# Patient Record
Sex: Female | Born: 1935 | Race: White | Hispanic: No | Smoking: Never smoker
Health system: Southern US, Community
[De-identification: ages and names within clinical notes are randomized; demographics above are authoritative.]

## PROBLEM LIST (undated history)

## (undated) DIAGNOSIS — J309 Allergic rhinitis, unspecified: Secondary | ICD-10-CM

## (undated) DIAGNOSIS — E785 Hyperlipidemia, unspecified: Secondary | ICD-10-CM

## (undated) DIAGNOSIS — I10 Essential (primary) hypertension: Secondary | ICD-10-CM

## (undated) DIAGNOSIS — F0390 Unspecified dementia without behavioral disturbance: Secondary | ICD-10-CM

## (undated) DIAGNOSIS — M170 Bilateral primary osteoarthritis of knee: Secondary | ICD-10-CM

## (undated) DIAGNOSIS — R413 Other amnesia: Secondary | ICD-10-CM

## (undated) HISTORY — DX: Other amnesia: R41.3

## (undated) HISTORY — DX: Bilateral primary osteoarthritis of knee: M17.0

## (undated) HISTORY — PX: TONSILLECTOMY: SUR1361

## (undated) HISTORY — DX: Allergic rhinitis, unspecified: J30.9

## (undated) HISTORY — PX: CHOLECYSTECTOMY: SHX55

---

## 2013-03-18 DIAGNOSIS — E7800 Pure hypercholesterolemia, unspecified: Secondary | ICD-10-CM | POA: Diagnosis not present

## 2013-03-18 DIAGNOSIS — M810 Age-related osteoporosis without current pathological fracture: Secondary | ICD-10-CM | POA: Diagnosis not present

## 2013-03-18 DIAGNOSIS — R413 Other amnesia: Secondary | ICD-10-CM | POA: Diagnosis not present

## 2013-03-18 DIAGNOSIS — I10 Essential (primary) hypertension: Secondary | ICD-10-CM | POA: Diagnosis not present

## 2013-06-18 ENCOUNTER — Emergency Department (HOSPITAL_BASED_OUTPATIENT_CLINIC_OR_DEPARTMENT_OTHER)
Admission: EM | Admit: 2013-06-18 | Discharge: 2013-06-18 | Disposition: A | Discharge status: Home / Self Care | Payer: Medicare Other | Attending: Emergency Medicine | Admitting: Emergency Medicine

## 2013-06-18 ENCOUNTER — Emergency Department (HOSPITAL_BASED_OUTPATIENT_CLINIC_OR_DEPARTMENT_OTHER): Payer: Medicare Other

## 2013-06-18 ENCOUNTER — Encounter (HOSPITAL_BASED_OUTPATIENT_CLINIC_OR_DEPARTMENT_OTHER): Payer: Self-pay | Admitting: Emergency Medicine

## 2013-06-18 DIAGNOSIS — W19XXXA Unspecified fall, initial encounter: Secondary | ICD-10-CM

## 2013-06-18 DIAGNOSIS — E785 Hyperlipidemia, unspecified: Secondary | ICD-10-CM | POA: Diagnosis not present

## 2013-06-18 DIAGNOSIS — Z79899 Other long term (current) drug therapy: Secondary | ICD-10-CM | POA: Insufficient documentation

## 2013-06-18 DIAGNOSIS — S0990XA Unspecified injury of head, initial encounter: Secondary | ICD-10-CM | POA: Insufficient documentation

## 2013-06-18 DIAGNOSIS — S0180XA Unspecified open wound of other part of head, initial encounter: Secondary | ICD-10-CM | POA: Diagnosis not present

## 2013-06-18 DIAGNOSIS — S032XXA Dislocation of tooth, initial encounter: Secondary | ICD-10-CM

## 2013-06-18 DIAGNOSIS — S199XXA Unspecified injury of neck, initial encounter: Secondary | ICD-10-CM | POA: Diagnosis not present

## 2013-06-18 DIAGNOSIS — T07XXXA Unspecified multiple injuries, initial encounter: Secondary | ICD-10-CM

## 2013-06-18 DIAGNOSIS — Y92009 Unspecified place in unspecified non-institutional (private) residence as the place of occurrence of the external cause: Secondary | ICD-10-CM | POA: Insufficient documentation

## 2013-06-18 DIAGNOSIS — F0390 Unspecified dementia without behavioral disturbance: Secondary | ICD-10-CM | POA: Insufficient documentation

## 2013-06-18 DIAGNOSIS — S0120XA Unspecified open wound of nose, initial encounter: Secondary | ICD-10-CM | POA: Diagnosis not present

## 2013-06-18 DIAGNOSIS — W010XXA Fall on same level from slipping, tripping and stumbling without subsequent striking against object, initial encounter: Secondary | ICD-10-CM | POA: Insufficient documentation

## 2013-06-18 DIAGNOSIS — Y9389 Activity, other specified: Secondary | ICD-10-CM | POA: Insufficient documentation

## 2013-06-18 DIAGNOSIS — IMO0002 Reserved for concepts with insufficient information to code with codable children: Secondary | ICD-10-CM | POA: Insufficient documentation

## 2013-06-18 DIAGNOSIS — S025XXA Fracture of tooth (traumatic), initial encounter for closed fracture: Secondary | ICD-10-CM | POA: Diagnosis not present

## 2013-06-18 DIAGNOSIS — S0993XA Unspecified injury of face, initial encounter: Secondary | ICD-10-CM | POA: Diagnosis not present

## 2013-06-18 DIAGNOSIS — I10 Essential (primary) hypertension: Secondary | ICD-10-CM | POA: Diagnosis not present

## 2013-06-18 HISTORY — DX: Essential (primary) hypertension: I10

## 2013-06-18 HISTORY — DX: Unspecified dementia, unspecified severity, without behavioral disturbance, psychotic disturbance, mood disturbance, and anxiety: F03.90

## 2013-06-18 HISTORY — DX: Hyperlipidemia, unspecified: E78.5

## 2013-06-18 MED ORDER — TRAMADOL HCL 50 MG PO TABS: 50.0000 mg | ORAL_TABLET | Freq: Four times a day (QID) | ORAL | Status: DC | PRN

## 2013-06-18 NOTE — ED Notes (Addendum)
Tripped and fell in her driveway, hitting her face on the concrete.  Denies LOC.  Reports injury to left knee as well.  Noting abrasions to her lip, nose,forehead, and a possible dental injury.

## 2013-06-18 NOTE — Discharge Instructions (Signed)
Abrasions An abrasion is a cut or scrape of the skin. Abrasions do not go through all layers of the skin. HOME CARE  If a bandage (dressing) was put on your wound, change it as told by your doctor. If the bandage sticks, soak it off with warm.  Wash the area with water and soap 2 times a day. Rinse off the soap. Pat the area dry with a clean towel.  Put on medicated cream (ointment) as told by your doctor.  Change your bandage right away if it gets wet or dirty.  Only take medicine as told by your doctor.  See your doctor within 24 48 hours to get your wound checked.  Check your wound for redness, puffiness (swelling), or yellowish-white fluid (pus). GET HELP RIGHT AWAY IF:   You have more pain in the wound.  You have redness, swelling, or tenderness around the wound.  You have pus coming from the wound.  You have a fever or lasting symptoms for more than 2 3 days.  You have a fever and your symptoms suddenly get worse.  You have a bad smell coming from the wound or bandage. MAKE SURE YOU:   Understand these instructions.  Will watch your condition.  Will get help right away if you are not doing well or get worse. Document Released: 07/09/2007 Document Revised: 10/15/2011 Document Reviewed: 12/24/2010 [facility] Patient Information 2014 [place], [place].   Head CT is CT of face and CT of neck all negative. Local wound care for the abrasions. Followup with your doctor as needed. As we discussed very important to followup with a dentist. Recommend soft diet to help her texture upper incisor. Return for any new or worse symptoms.

## 2013-06-18 NOTE — ED Provider Notes (Signed)
CSN: [id number]     Arrival date & time 06/18/13  1301 History   First MD Initiated Contact with Patient 06/18/13 1321     Chief Complaint  Patient presents with  . Fall     (Consider location/radiation/quality/duration/timing/severity/associated sxs/prior Treatment) Patient is a 80 y.o. female presenting with fall. The history is provided by the patient and a relative.  Fall Pertinent negatives include no chest pain, no abdominal pain and no shortness of breath.   patient tripped and fell on the driveway landing on her face. No loss of consciousness. Patient with complaint of an abrasion to left knee and abrasions to her upper lip face for head and nose. Questionable dental injury her left upper tooth is pushed back. Denies any chest pain shortness of breath back pain or abdominal pain. No hip pain. Patient has been up and walking since the incident.  Past Medical History  Diagnosis Date  . Hypertension   . Hyperlipidemia   . Dementia    Past Surgical History  Procedure Laterality Date  . Tonsillectomy    . Cholecystectomy     No family history on file. History  Substance Use Topics  . Smoking status: Never Smoker   . Smokeless tobacco: Not on file  . Alcohol Use: Yes   OB History   Grav Para Term Preterm Abortions TAB SAB Ect Mult Living                 Review of Systems  Constitutional: Negative for fever.  HENT: Positive for nosebleeds. Negative for congestion.   Eyes: Negative for pain and visual disturbance.  Respiratory: Negative for shortness of breath.   Cardiovascular: Negative for chest pain.  Gastrointestinal: Negative for abdominal pain.  Genitourinary: Negative for dysuria.  Musculoskeletal: Negative for back pain.  Skin: Positive for wound.  Neurological: Negative for syncope.  Hematological: Does not bruise/bleed easily.  Psychiatric/Behavioral: Negative for confusion.      Allergies  Review of patient's allergies indicates no known  allergies.  Home Medications   Prior to Admission medications   Medication Sig Start Date End Date Taking? Authorizing Provider  donepezil (ARICEPT) 10 MG tablet Take 10 mg by mouth at bedtime.   Yes Historical Provider, MD  hydrochlorothiazide (HYDRODIURIL) 25 MG tablet Take 12.5 mg by mouth. Monday, Wed, Friday   Yes Historical Provider, MD  losartan (COZAAR) 100 MG tablet Take 100 mg by mouth daily.   Yes Historical Provider, MD  memantine (NAMENDA) 10 MG tablet Take 10 mg by mouth 2 (two) times daily.   Yes Historical Provider, MD  risedronate (ACTONEL) 35 MG tablet Take 35 mg by mouth every 7 (seven) days. with water on empty stomach, nothing by mouth or lie down for next 30 minutes.   Yes Historical Provider, MD  simvastatin (ZOCOR) 20 MG tablet Take 20 mg by mouth daily.   Yes Historical Provider, MD  traMADol (ULTRAM) 50 MG tablet Take 1 tablet (50 mg total) by mouth every 6 (six) hours as needed. 06/18/13   Mervin Kung, MD   BP 150/75  Pulse 89  Temp(Src) 98.3 F (36.8 C) (Oral)  Resp 20  Ht 5\' 2"  (1.575 m)  Wt 142 lb (64.411 kg)  BMI 25.97 kg/m2  SpO2 98% Physical Exam  Nursing note and vitals reviewed. Constitutional: She is oriented to person, place, and time. She appears well-developed and well-nourished. She appears distressed.  HENT:  Head: Normocephalic.  Mouth/Throat: Oropharynx is clear and moist.  Right nares  with some dried blood no active bleeding. Abrasion to the bridge of the nose 4 head and upper lip. No through and through lacerations of the upper lip. Tongue is okay. Teeth intact with the exception of the left upper incisor which is pushed back but it is not easily movable. No hematoma to the gum line.  Eyes: Conjunctivae and EOM are normal. Pupils are equal, round, and reactive to light.  Neck: Normal range of motion. Neck supple.  Cardiovascular: Normal rate, regular rhythm and normal heart sounds.   Pulmonary/Chest: Effort normal and breath sounds  normal. No respiratory distress.  Abdominal: Soft. Bowel sounds are normal. There is no tenderness.  Musculoskeletal: Normal range of motion.  Superficial abrasion to left knee no active bleeding.  Neurological: She is alert and oriented to person, place, and time. No cranial nerve deficit. She exhibits normal muscle tone. Coordination normal.  Skin: Skin is warm.    ED Course  Procedures (including critical care time) Labs Review Labs Reviewed - No data to display  Imaging Review Ct Head Wo Contrast  06/18/2013   CLINICAL DATA:  Status post fall with face forward onto a cement driveway. Laceration to the forehead, bridge of nose and upper lip.  EXAM: CT HEAD WITHOUT CONTRAST  CT MAXILLOFACIAL WITHOUT CONTRAST  CT CERVICAL SPINE WITHOUT CONTRAST  TECHNIQUE: Multidetector CT imaging of the head, cervical spine, and maxillofacial structures were performed using the standard protocol without intravenous contrast. Multiplanar CT image reconstructions of the cervical spine and maxillofacial structures were also generated.  COMPARISON:  None.  FINDINGS: CT HEAD FINDINGS  There is chronic diffuse atrophy. Chronic bilateral periventricular white matter small vessel ischemic changes identified. There is no midline shift, hydrocephalus, or mass. No acute hemorrhage or acute transcortical infarct is identified. The bony calvarium is intact. There is mucoperiosteal thickening of the sphenoid sinus.  CT MAXILLOFACIAL FINDINGS  There is soft tissue swelling/ small hematoma surrounding the nose particularly on the right. There is no acute fracture or dislocation. Orbits are normal. There is minimal mucoperiosteal thickening of the sphenoid sinus.  CT CERVICAL SPINE FINDINGS  There is no acute fracture or dislocation. There are degenerative joint changes with narrowed joint space and osteophyte formation. The prevertebral soft tissues are normal. There is mild generalized enlargement of the right thyroid gland  relative to the left thyroid gland but this is incompletely included. The visualized lung apices are clear.  IMPRESSION: No focal acute intracranial abnormality identified. Chronic diffuse atrophy. Chronic bilateral periventricular white matter small vessel ischemic change.  Soft tissue swelling/ hematoma surrounding the nose particularly on the right. There is no acute fracture or dislocation of maxillofacial bones.  Degenerative joint changes of cervical spine. There is no acute fracture or dislocation of cervical spine.   Electronically Signed   By: Abelardo Diesel M.D.   On: 06/18/2013 14:23   Ct Cervical Spine Wo Contrast  06/18/2013   CLINICAL DATA:  Status post fall with face forward onto a cement driveway. Laceration to the forehead, bridge of nose and upper lip.  EXAM: CT HEAD WITHOUT CONTRAST  CT MAXILLOFACIAL WITHOUT CONTRAST  CT CERVICAL SPINE WITHOUT CONTRAST  TECHNIQUE: Multidetector CT imaging of the head, cervical spine, and maxillofacial structures were performed using the standard protocol without intravenous contrast. Multiplanar CT image reconstructions of the cervical spine and maxillofacial structures were also generated.  COMPARISON:  None.  FINDINGS: CT HEAD FINDINGS  There is chronic diffuse atrophy. Chronic bilateral periventricular white matter small vessel  ischemic changes identified. There is no midline shift, hydrocephalus, or mass. No acute hemorrhage or acute transcortical infarct is identified. The bony calvarium is intact. There is mucoperiosteal thickening of the sphenoid sinus.  CT MAXILLOFACIAL FINDINGS  There is soft tissue swelling/ small hematoma surrounding the nose particularly on the right. There is no acute fracture or dislocation. Orbits are normal. There is minimal mucoperiosteal thickening of the sphenoid sinus.  CT CERVICAL SPINE FINDINGS  There is no acute fracture or dislocation. There are degenerative joint changes with narrowed joint space and osteophyte  formation. The prevertebral soft tissues are normal. There is mild generalized enlargement of the right thyroid gland relative to the left thyroid gland but this is incompletely included. The visualized lung apices are clear.  IMPRESSION: No focal acute intracranial abnormality identified. Chronic diffuse atrophy. Chronic bilateral periventricular white matter small vessel ischemic change.  Soft tissue swelling/ hematoma surrounding the nose particularly on the right. There is no acute fracture or dislocation of maxillofacial bones.  Degenerative joint changes of cervical spine. There is no acute fracture or dislocation of cervical spine.   Electronically Signed   By: Abelardo Diesel M.D.   On: 06/18/2013 14:23   Ct Maxillofacial Wo Cm  06/18/2013   CLINICAL DATA:  Status post fall with face forward onto a cement driveway. Laceration to the forehead, bridge of nose and upper lip.  EXAM: CT HEAD WITHOUT CONTRAST  CT MAXILLOFACIAL WITHOUT CONTRAST  CT CERVICAL SPINE WITHOUT CONTRAST  TECHNIQUE: Multidetector CT imaging of the head, cervical spine, and maxillofacial structures were performed using the standard protocol without intravenous contrast. Multiplanar CT image reconstructions of the cervical spine and maxillofacial structures were also generated.  COMPARISON:  None.  FINDINGS: CT HEAD FINDINGS  There is chronic diffuse atrophy. Chronic bilateral periventricular white matter small vessel ischemic changes identified. There is no midline shift, hydrocephalus, or mass. No acute hemorrhage or acute transcortical infarct is identified. The bony calvarium is intact. There is mucoperiosteal thickening of the sphenoid sinus.  CT MAXILLOFACIAL FINDINGS  There is soft tissue swelling/ small hematoma surrounding the nose particularly on the right. There is no acute fracture or dislocation. Orbits are normal. There is minimal mucoperiosteal thickening of the sphenoid sinus.  CT CERVICAL SPINE FINDINGS  There is no acute  fracture or dislocation. There are degenerative joint changes with narrowed joint space and osteophyte formation. The prevertebral soft tissues are normal. There is mild generalized enlargement of the right thyroid gland relative to the left thyroid gland but this is incompletely included. The visualized lung apices are clear.  IMPRESSION: No focal acute intracranial abnormality identified. Chronic diffuse atrophy. Chronic bilateral periventricular white matter small vessel ischemic change.  Soft tissue swelling/ hematoma surrounding the nose particularly on the right. There is no acute fracture or dislocation of maxillofacial bones.  Degenerative joint changes of cervical spine. There is no acute fracture or dislocation of cervical spine.   Electronically Signed   By: Abelardo Diesel M.D.   On: 06/18/2013 14:23     EKG Interpretation None      MDM   Final diagnoses:  Fall  Abrasions of multiple sites  Tooth avulsion  Head injury    Patient with a fall no loss of consciousness. Abrasions to the 4 head and face. An upper lip. CT of head neck and face without any acute fractures or injuries. However clinically patient's left upper incisor is avulsed back will need to followup with a dentist. Patient to soft  diet the meantime. Patient was treated with tramadol for pain and also a wound care for the abrasions. Patient denied any neck pain back pain arm pain hip pain. Patient also with abrasion to the left knee.    Mervin Kung, MD 06/18/13 [phone]

## 2013-09-23 DIAGNOSIS — E559 Vitamin D deficiency, unspecified: Secondary | ICD-10-CM | POA: Diagnosis not present

## 2013-09-23 DIAGNOSIS — E7800 Pure hypercholesterolemia, unspecified: Secondary | ICD-10-CM | POA: Diagnosis not present

## 2013-09-23 DIAGNOSIS — Z23 Encounter for immunization: Secondary | ICD-10-CM | POA: Diagnosis not present

## 2013-09-23 DIAGNOSIS — I10 Essential (primary) hypertension: Secondary | ICD-10-CM | POA: Diagnosis not present

## 2015-01-04 DIAGNOSIS — I10 Essential (primary) hypertension: Secondary | ICD-10-CM | POA: Diagnosis not present

## 2015-01-04 DIAGNOSIS — N390 Urinary tract infection, site not specified: Secondary | ICD-10-CM | POA: Diagnosis not present

## 2015-01-04 DIAGNOSIS — J309 Allergic rhinitis, unspecified: Secondary | ICD-10-CM | POA: Diagnosis not present

## 2015-01-04 DIAGNOSIS — M810 Age-related osteoporosis without current pathological fracture: Secondary | ICD-10-CM | POA: Diagnosis not present

## 2015-01-04 DIAGNOSIS — M170 Bilateral primary osteoarthritis of knee: Secondary | ICD-10-CM | POA: Diagnosis not present

## 2015-01-04 DIAGNOSIS — E7800 Pure hypercholesterolemia, unspecified: Secondary | ICD-10-CM | POA: Diagnosis not present

## 2015-01-04 DIAGNOSIS — R413 Other amnesia: Secondary | ICD-10-CM | POA: Diagnosis not present

## 2015-01-16 DIAGNOSIS — M810 Age-related osteoporosis without current pathological fracture: Secondary | ICD-10-CM | POA: Diagnosis not present

## 2015-01-16 DIAGNOSIS — E559 Vitamin D deficiency, unspecified: Secondary | ICD-10-CM | POA: Diagnosis not present

## 2015-01-16 DIAGNOSIS — I10 Essential (primary) hypertension: Secondary | ICD-10-CM | POA: Diagnosis not present

## 2015-01-16 DIAGNOSIS — Z0000 Encounter for general adult medical examination without abnormal findings: Secondary | ICD-10-CM | POA: Diagnosis not present

## 2015-01-16 DIAGNOSIS — E7800 Pure hypercholesterolemia, unspecified: Secondary | ICD-10-CM | POA: Diagnosis not present

## 2015-01-22 ENCOUNTER — Encounter: Payer: Self-pay | Admitting: Neurology

## 2015-01-22 ENCOUNTER — Ambulatory Visit: Payer: Medicare Other | Admitting: Neurology

## 2015-01-22 ENCOUNTER — Telehealth: Payer: Self-pay

## 2015-01-22 NOTE — Telephone Encounter (Signed)
Patient did not show to new appt

## 2015-02-09 IMAGING — CT CT CERVICAL SPINE W/O CM
4 of 5 series · 15 of 33 positions shown, 17 images · non-contrast
Comparison: None.

CLINICAL DATA: Status post fall with face forward onto a cement
driveway. Laceration to the forehead, bridge of nose and upper lip.

EXAM:
CT HEAD WITHOUT CONTRAST
CT MAXILLOFACIAL WITHOUT CONTRAST
CT CERVICAL SPINE WITHOUT CONTRAST
TECHNIQUE: Multidetector CT imaging of the head, cervical spine, and
maxillofacial structures were performed using the standard protocol
without intravenous contrast. Multiplanar CT image reconstructions
of the cervical spine and maxillofacial structures were also
generated.

[Series 11: c_spine 2.0 b41s st · axial · 0.23mm/px · z∈[-266,-194]mm · 3 of 73 slices shown]
[im 19/73  bone]
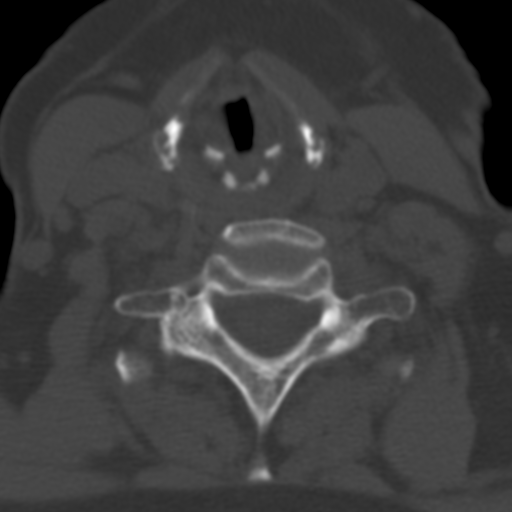
[im 37/73  bone]
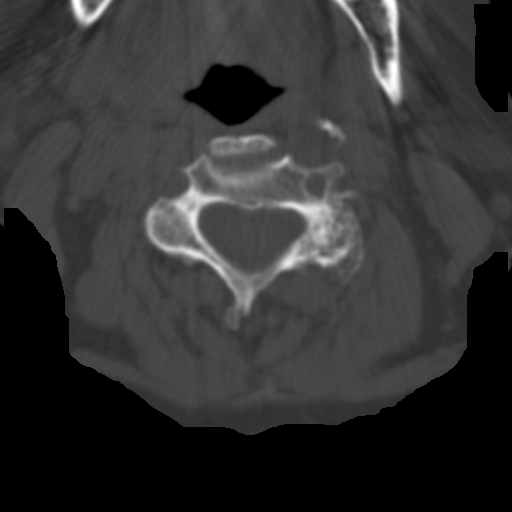
[im 55/73  bone]
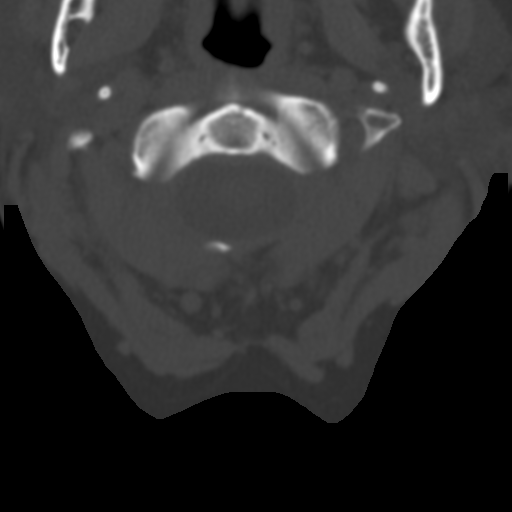

[Series 14: c_spine 2.0 coronal · coronal · 0.25mm/px · 3 of 34 slices shown]
[im 7/34  bone]
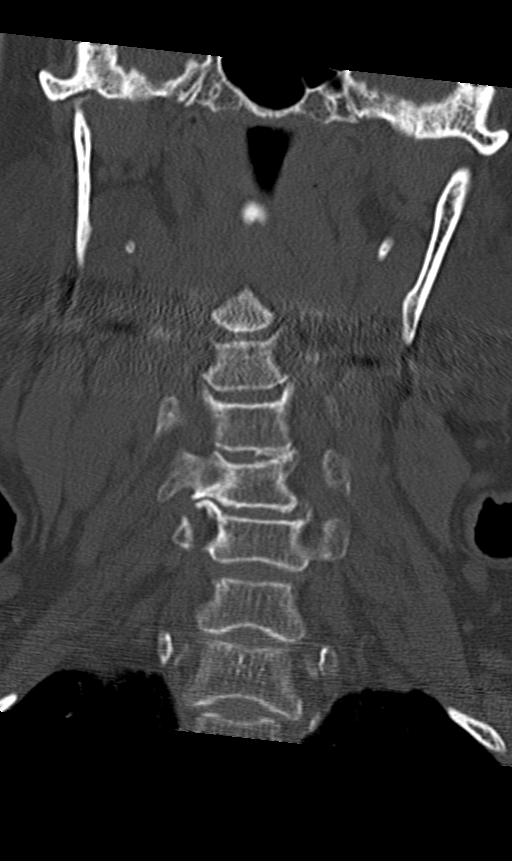
[im 14/34  bone]
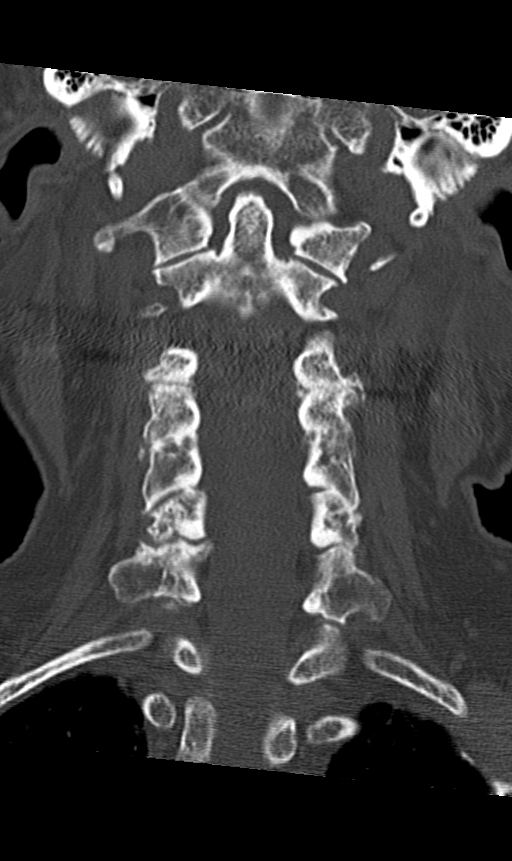
[im 20/34  bone]
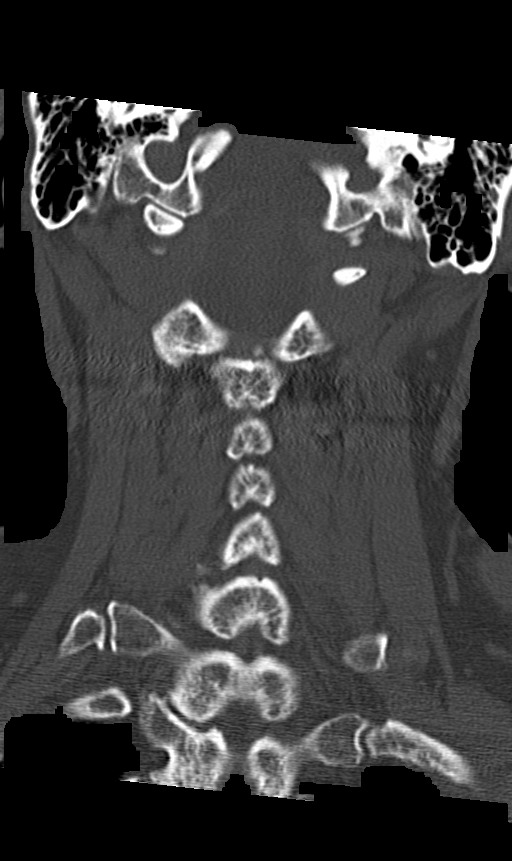

[Series 15: c_spine 2.0 sagittal · sagittal · 0.37mm/px · 5 of 38 slices shown, 6 images]
[im 13/38  bone]
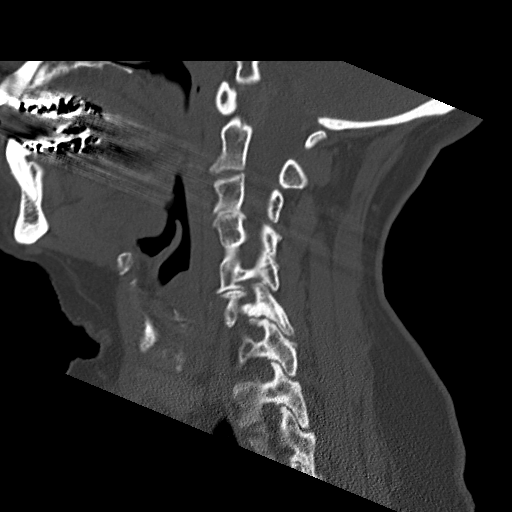
[im 16/38  bone]
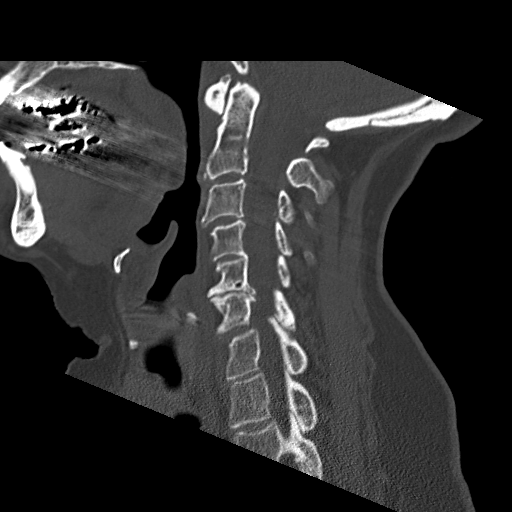
[im 19/38  soft-tissue]
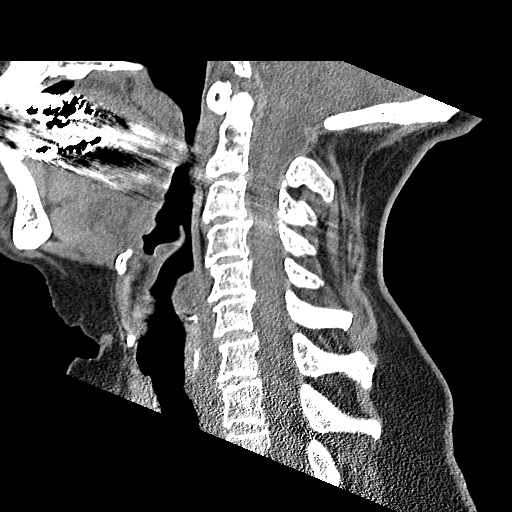
[im 19/38  bone]
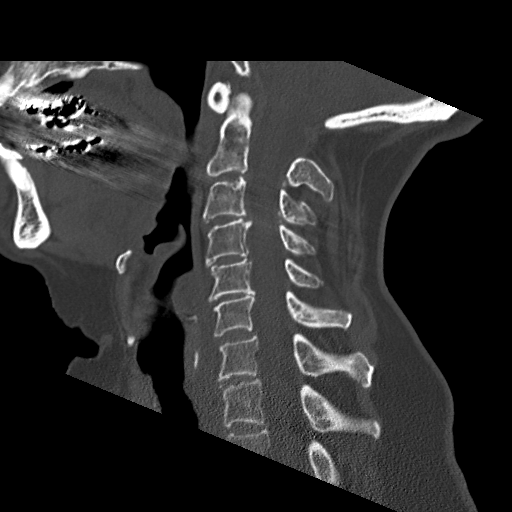
[im 22/38  bone]
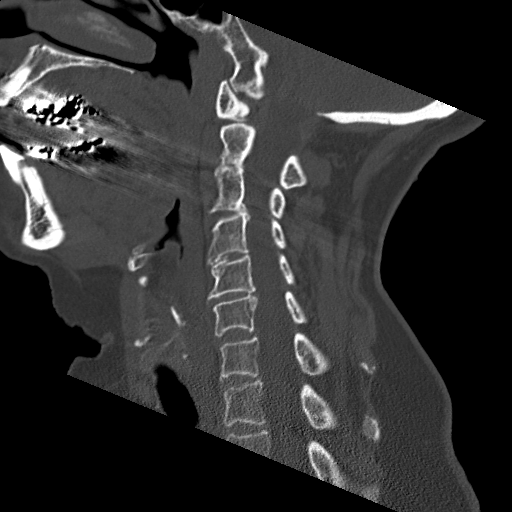
[im 25/38  bone]
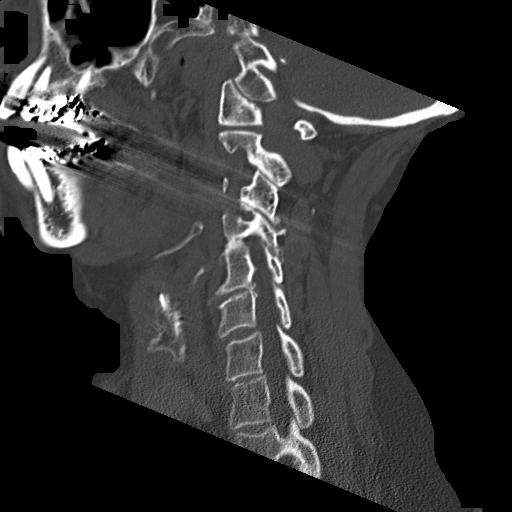

[Series 16: c_spine 2.0 orth ax · axial · 0.26mm/px · z∈[-306,-217]mm · 4 of 80 slices shown, 5 images]
[im 16/80  soft-tissue]
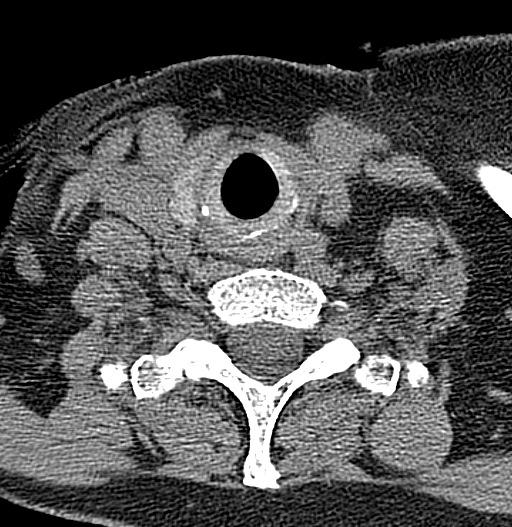
[im 16/80  bone]
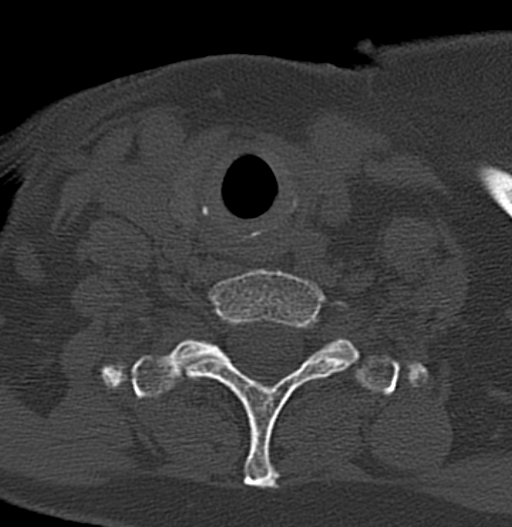
[im 32/80  bone]
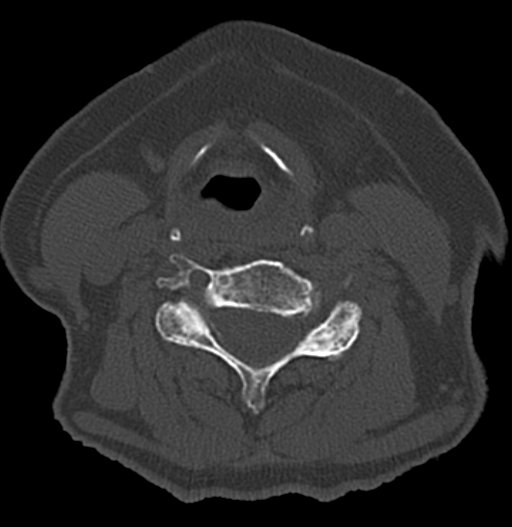
[im 48/80  bone]
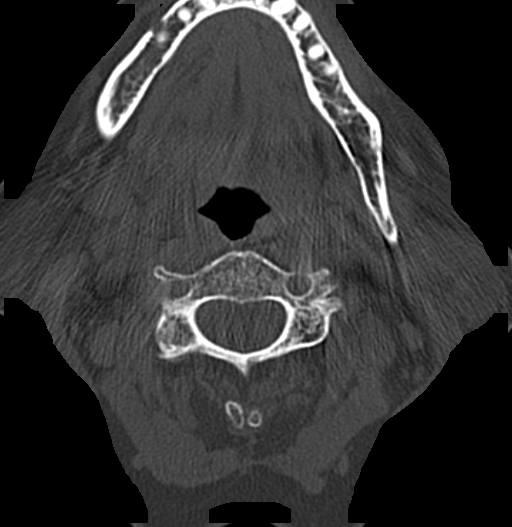
[im 64/80  bone]
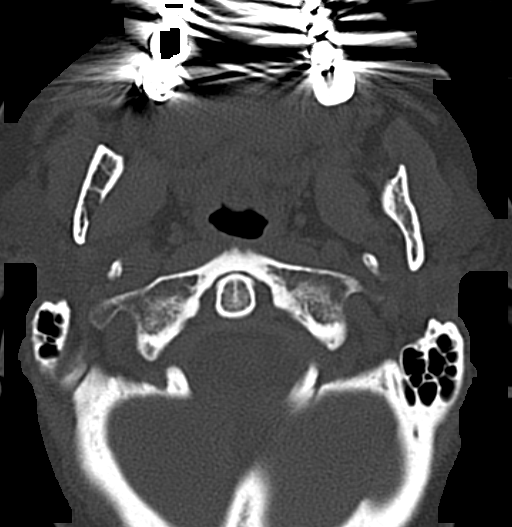

[15 of 33 positions shown; findings below may reference images not displayed]

FINDINGS: CT HEAD FINDINGS

There is chronic diffuse atrophy. Chronic bilateral periventricular
white matter small vessel ischemic changes identified. There is no
midline shift, hydrocephalus, or mass. No acute hemorrhage or acute
transcortical infarct is identified. The bony calvarium is intact.
There is mucoperiosteal thickening of the sphenoid sinus.

CT MAXILLOFACIAL FINDINGS

There is soft tissue swelling/ small hematoma surrounding the nose
particularly on the right. There is no acute fracture or
dislocation. Orbits are normal. There is minimal mucoperiosteal
thickening of the sphenoid sinus.

CT CERVICAL SPINE FINDINGS

There is no acute fracture or dislocation. There are degenerative
joint changes with narrowed joint space and osteophyte formation.
The prevertebral soft tissues are normal. There is mild generalized
enlargement of the right thyroid gland relative to the left thyroid
gland but this is incompletely included. The visualized lung apices
are clear.
IMPRESSION: No focal acute intracranial abnormality identified. Chronic diffuse
atrophy. Chronic bilateral periventricular white matter small vessel
ischemic change.

Soft tissue swelling/ hematoma surrounding the nose particularly on
the right. There is no acute fracture or dislocation of
maxillofacial bones.

Degenerative joint changes of cervical spine. There is no acute
fracture or dislocation of cervical spine.

## 2015-07-17 DIAGNOSIS — I10 Essential (primary) hypertension: Secondary | ICD-10-CM | POA: Diagnosis not present

## 2015-07-17 DIAGNOSIS — E7800 Pure hypercholesterolemia, unspecified: Secondary | ICD-10-CM | POA: Diagnosis not present

## 2015-07-17 DIAGNOSIS — M810 Age-related osteoporosis without current pathological fracture: Secondary | ICD-10-CM | POA: Diagnosis not present

## 2015-07-17 DIAGNOSIS — Z0000 Encounter for general adult medical examination without abnormal findings: Secondary | ICD-10-CM | POA: Diagnosis not present

## 2016-01-08 DIAGNOSIS — E559 Vitamin D deficiency, unspecified: Secondary | ICD-10-CM | POA: Diagnosis not present

## 2016-01-08 DIAGNOSIS — I10 Essential (primary) hypertension: Secondary | ICD-10-CM | POA: Diagnosis not present

## 2016-01-08 DIAGNOSIS — E7800 Pure hypercholesterolemia, unspecified: Secondary | ICD-10-CM | POA: Diagnosis not present

## 2016-01-08 DIAGNOSIS — M810 Age-related osteoporosis without current pathological fracture: Secondary | ICD-10-CM | POA: Diagnosis not present

## 2016-01-08 DIAGNOSIS — R413 Other amnesia: Secondary | ICD-10-CM | POA: Diagnosis not present

## 2016-01-15 DIAGNOSIS — I10 Essential (primary) hypertension: Secondary | ICD-10-CM | POA: Diagnosis not present

## 2016-01-15 DIAGNOSIS — E673 Hypervitaminosis D: Secondary | ICD-10-CM | POA: Diagnosis not present

## 2016-07-16 DIAGNOSIS — J309 Allergic rhinitis, unspecified: Secondary | ICD-10-CM | POA: Diagnosis not present

## 2016-07-16 DIAGNOSIS — R413 Other amnesia: Secondary | ICD-10-CM | POA: Diagnosis not present

## 2016-07-16 DIAGNOSIS — I10 Essential (primary) hypertension: Secondary | ICD-10-CM | POA: Diagnosis not present

## 2016-07-16 DIAGNOSIS — H6123 Impacted cerumen, bilateral: Secondary | ICD-10-CM | POA: Diagnosis not present

## 2016-07-16 DIAGNOSIS — Z23 Encounter for immunization: Secondary | ICD-10-CM | POA: Diagnosis not present

## 2016-07-16 DIAGNOSIS — M170 Bilateral primary osteoarthritis of knee: Secondary | ICD-10-CM | POA: Diagnosis not present

## 2016-07-16 DIAGNOSIS — E7800 Pure hypercholesterolemia, unspecified: Secondary | ICD-10-CM | POA: Diagnosis not present

## 2017-02-10 DIAGNOSIS — M810 Age-related osteoporosis without current pathological fracture: Secondary | ICD-10-CM | POA: Diagnosis not present

## 2017-02-10 DIAGNOSIS — I10 Essential (primary) hypertension: Secondary | ICD-10-CM | POA: Diagnosis not present

## 2017-03-31 ENCOUNTER — Other Ambulatory Visit: Payer: Self-pay

## 2017-03-31 ENCOUNTER — Encounter (HOSPITAL_BASED_OUTPATIENT_CLINIC_OR_DEPARTMENT_OTHER): Payer: Self-pay | Admitting: *Deleted

## 2017-03-31 ENCOUNTER — Emergency Department (HOSPITAL_BASED_OUTPATIENT_CLINIC_OR_DEPARTMENT_OTHER)
Admission: EM | Admit: 2017-03-31 | Discharge: 2017-03-31 | Disposition: A | Discharge status: Home / Self Care | Payer: Medicare Other | Attending: Emergency Medicine | Admitting: Emergency Medicine

## 2017-03-31 ENCOUNTER — Emergency Department (HOSPITAL_BASED_OUTPATIENT_CLINIC_OR_DEPARTMENT_OTHER): Payer: Medicare Other

## 2017-03-31 DIAGNOSIS — Y929 Unspecified place or not applicable: Secondary | ICD-10-CM | POA: Insufficient documentation

## 2017-03-31 DIAGNOSIS — Y939 Activity, unspecified: Secondary | ICD-10-CM | POA: Diagnosis not present

## 2017-03-31 DIAGNOSIS — Y999 Unspecified external cause status: Secondary | ICD-10-CM | POA: Diagnosis not present

## 2017-03-31 DIAGNOSIS — X58XXXA Exposure to other specified factors, initial encounter: Secondary | ICD-10-CM | POA: Diagnosis not present

## 2017-03-31 DIAGNOSIS — S59901A Unspecified injury of right elbow, initial encounter: Secondary | ICD-10-CM | POA: Diagnosis present

## 2017-03-31 DIAGNOSIS — F0390 Unspecified dementia without behavioral disturbance: Secondary | ICD-10-CM | POA: Insufficient documentation

## 2017-03-31 DIAGNOSIS — S50311A Abrasion of right elbow, initial encounter: Secondary | ICD-10-CM | POA: Diagnosis not present

## 2017-03-31 DIAGNOSIS — I10 Essential (primary) hypertension: Secondary | ICD-10-CM | POA: Insufficient documentation

## 2017-03-31 DIAGNOSIS — Z79899 Other long term (current) drug therapy: Secondary | ICD-10-CM | POA: Insufficient documentation

## 2017-03-31 DIAGNOSIS — Z7982 Long term (current) use of aspirin: Secondary | ICD-10-CM | POA: Diagnosis not present

## 2017-03-31 MED ORDER — DOXYCYCLINE HYCLATE 100 MG PO TABS
100.0000 mg | ORAL_TABLET | Freq: Once | ORAL | Status: AC
  Administered 2017-03-31: 100 mg via ORAL
  Filled 2017-03-31: qty 1

## 2017-03-31 MED ORDER — DOXYCYCLINE HYCLATE 100 MG PO CAPS: 100.0000 mg | ORAL_CAPSULE | Freq: Two times a day (BID) | ORAL | 0 refills | Status: DC

## 2017-03-31 MED ORDER — BACITRACIN ZINC 500 UNIT/GM EX OINT: 1.0000 "application " | TOPICAL_OINTMENT | Freq: Two times a day (BID) | CUTANEOUS | 0 refills | Status: DC

## 2017-03-31 MED FILL — DOXYCYCLINE HYC 100 MG CAP: 100 | 7 days supply | Qty: 14 | Fill #0

## 2017-03-31 NOTE — ED Triage Notes (Signed)
Abrasion to her right elbow. Her daughter saw it last night.

## 2017-03-31 NOTE — ED Provider Notes (Signed)
[facility] EMERGENCY DEPARTMENT Provider Note   CSN: [id number] Arrival date & time: 03/31/17  1237     History   Chief Complaint Chief Complaint  Patient presents with  . Elbow Injury    HPI Candice Horne is a 84 y.o. female hx of HTN, dementia, HL, here presenting with right elbow abrasion.  Patient has a history of dementia and lives at home with her daughter.  Around last night, daughter noticed that there was an abrasion on the right elbow area.  Patient did not remember how she fell.  There is no witnessed syncope or loss of consciousness per daughter.  Patient denies being on blood thinners and denies any falls.  Patient did have a tetanus shot in 2011.   The history is provided by the patient.   Level V caveat- dementia   Past Medical History:  Diagnosis Date  . Allergic rhinitis   . Dementia   . Hyperlipidemia   . Hypertension   . Memory loss   . Osteoarthritis of both knees     There are no active problems to display for this patient.   Past Surgical History:  Procedure Laterality Date  . CHOLECYSTECTOMY    . TONSILLECTOMY      OB History    No data available       Home Medications    Prior to Admission medications   Medication Sig Start Date End Date Taking? Authorizing Provider  aspirin 81 MG tablet Take 81 mg by mouth daily.   Yes [provider]  b complex vitamins tablet Take 1 tablet by mouth daily.   Yes [provider]  cholecalciferol (VITAMIN D) 400 UNITS TABS tablet Take 400 Units by mouth.   Yes [provider]  donepezil (ARICEPT) 10 MG tablet Take 10 mg by mouth at bedtime.   Yes [provider]  Glucosamine-MSM-Hyaluronic Acd (JOINT HEALTH PO) Take by mouth.   Yes [provider]  hydrochlorothiazide (HYDRODIURIL) 25 MG tablet Take 12.5 mg by mouth. Monday, Wed, Friday   Yes [provider]  losartan (COZAAR) 100 MG tablet Take 100 mg by mouth daily.   Yes [provider]  memantine (NAMENDA) 10 MG tablet Take 10 mg by mouth 2 (two) times daily.   Yes [provider]  Omega-3 Fatty Acids (FISH OIL) 1000 MG CAPS Take by mouth.   Yes [provider]  simvastatin (ZOCOR) 20 MG tablet Take 20 mg by mouth daily.   Yes [provider]  risedronate (ACTONEL) 35 MG tablet Take 35 mg by mouth every 7 (seven) days. with water on empty stomach, nothing by mouth or lie down for next 30 minutes.    [provider]    Family History Family History  Problem Relation Age of Onset  . Heart attack Mother   . Cancer Father     Social History Social History   Tobacco Use  . Smoking status: Never Smoker  . Smokeless tobacco: Never Used  Substance Use Topics  . Alcohol use: Yes  . Drug use: No     Allergies   Patient has no known allergies.   Review of Systems Review of Systems  Skin: Positive for wound.  All other systems reviewed and are negative.    Physical Exam Updated Vital Signs BP (!) 122/43   Pulse 74   Temp 98.7 F (37.1 C) (Oral)   Resp 18   Ht 4' 11.5" (1.511 m)   Wt  57.2 kg (126 lb)   SpO2 99%   BMI 25.02 kg/m   Physical Exam  Constitutional: She is oriented to person, place, and time. She appears well-developed.  Demented, pleasant, NAD   HENT:  Head: Normocephalic and atraumatic.  Mouth/Throat: Oropharynx is clear and moist.  No obvious head trauma   Eyes: Conjunctivae and EOM are normal. Pupils are equal, round, and reactive to light.  Neck: Normal range of motion. Neck supple.  Cardiovascular: Normal rate, regular rhythm and normal heart sounds.  Pulmonary/Chest: Effort normal and breath sounds normal. No stridor. No respiratory distress. She has no wheezes.  Abdominal: Soft. Bowel sounds are normal. She exhibits no distension. There is no tenderness.  Musculoskeletal:  Small abrasion lateral aspect R elbow, surrounding redness, no obvious elbow effusion, nl ROM R elbow. 2+  pulses   Neurological: She is alert and oriented to person, place, and time.  Skin: Skin is warm.  Psychiatric: She has a normal mood and affect.  Nursing note and vitals reviewed.    ED Treatments / Results  Labs (all labs ordered are listed, but only abnormal results are displayed) Labs Reviewed - No data to display  EKG  EKG Interpretation None       Radiology Dg Elbow Complete Right  Result Date: 03/31/2017 CLINICAL DATA:  Abrasion due to unwitnessed trauma EXAM: RIGHT ELBOW - COMPLETE 3+ VIEW COMPARISON:  None. FINDINGS: Frontal, lateral, and bilateral oblique views were obtained. No fracture or dislocation. No joint effusion. Joint spaces appear normal. No erosive change. No soft tissue air or radiopaque foreign body. IMPRESSION: No fracture or dislocation.  No evident arthropathic change. Electronically Signed   By: Lowella Grip III M.D.   On: 03/31/2017 13:31    Procedures Procedures (including critical care time)  Medications Ordered in ED Medications  doxycycline (VIBRA-TABS) tablet 100 mg (not administered)     Initial Impression / Assessment and Plan / ED Course  I have reviewed the triage vital signs and the nursing notes.  Pertinent labs & imaging results that were available during my care of the patient were reviewed by me and considered in my medical decision making (see chart for details).     Candice Horne is a 84 y.o. female here with R elbow injury with abrasion. No witnessed syncope at home and patient is demented. Has no head/neck/chest/ab/pel trauma. Xray showed no fracture. Tdap up to date and she is neurovascularly intact. Nursing cleaned up the wound. Will give doxycycline empirically to prevent infection.      Final Clinical Impressions(s) / ED Diagnoses   Final diagnoses:  None    ED Discharge Orders    None       Drenda Freeze, MD 03/31/17 1408

## 2017-03-31 NOTE — Discharge Instructions (Signed)
Keep wound clean and dry.   Take doxycycline twice daily for a week.   You may apply bacitracin ointment as well to prevent infection   Observe for signs of infection.   See your doctor  Return to ER if you have worse redness around the wound, purulent discharge, fever, elbow swelling

## 2017-06-30 DIAGNOSIS — M546 Pain in thoracic spine: Secondary | ICD-10-CM | POA: Diagnosis not present

## 2017-06-30 DIAGNOSIS — M545 Low back pain: Secondary | ICD-10-CM | POA: Diagnosis not present

## 2017-06-30 DIAGNOSIS — R0781 Pleurodynia: Secondary | ICD-10-CM | POA: Diagnosis not present

## 2017-06-30 DIAGNOSIS — R109 Unspecified abdominal pain: Secondary | ICD-10-CM | POA: Diagnosis not present

## 2017-07-02 DIAGNOSIS — N281 Cyst of kidney, acquired: Secondary | ICD-10-CM | POA: Diagnosis not present

## 2017-07-02 DIAGNOSIS — R1032 Left lower quadrant pain: Secondary | ICD-10-CM | POA: Diagnosis not present

## 2017-07-02 DIAGNOSIS — R1031 Right lower quadrant pain: Secondary | ICD-10-CM | POA: Diagnosis not present

## 2017-07-20 DIAGNOSIS — I10 Essential (primary) hypertension: Secondary | ICD-10-CM | POA: Diagnosis not present

## 2017-07-20 DIAGNOSIS — M810 Age-related osteoporosis without current pathological fracture: Secondary | ICD-10-CM | POA: Diagnosis not present

## 2017-07-20 DIAGNOSIS — E7800 Pure hypercholesterolemia, unspecified: Secondary | ICD-10-CM | POA: Diagnosis not present

## 2017-07-20 DIAGNOSIS — N390 Urinary tract infection, site not specified: Secondary | ICD-10-CM | POA: Diagnosis not present

## 2017-07-23 DIAGNOSIS — Z0000 Encounter for general adult medical examination without abnormal findings: Secondary | ICD-10-CM | POA: Diagnosis not present

## 2017-07-23 DIAGNOSIS — M810 Age-related osteoporosis without current pathological fracture: Secondary | ICD-10-CM | POA: Diagnosis not present

## 2017-07-23 DIAGNOSIS — Z8639 Personal history of other endocrine, nutritional and metabolic disease: Secondary | ICD-10-CM | POA: Diagnosis not present

## 2017-07-23 DIAGNOSIS — J309 Allergic rhinitis, unspecified: Secondary | ICD-10-CM | POA: Diagnosis not present

## 2017-07-23 DIAGNOSIS — M170 Bilateral primary osteoarthritis of knee: Secondary | ICD-10-CM | POA: Diagnosis not present

## 2017-07-23 DIAGNOSIS — H9313 Tinnitus, bilateral: Secondary | ICD-10-CM | POA: Diagnosis not present

## 2017-07-23 DIAGNOSIS — Z9049 Acquired absence of other specified parts of digestive tract: Secondary | ICD-10-CM | POA: Diagnosis not present

## 2017-07-23 DIAGNOSIS — R413 Other amnesia: Secondary | ICD-10-CM | POA: Diagnosis not present

## 2017-07-23 DIAGNOSIS — I10 Essential (primary) hypertension: Secondary | ICD-10-CM | POA: Diagnosis not present

## 2017-07-23 DIAGNOSIS — R32 Unspecified urinary incontinence: Secondary | ICD-10-CM | POA: Diagnosis not present

## 2017-07-23 DIAGNOSIS — E7800 Pure hypercholesterolemia, unspecified: Secondary | ICD-10-CM | POA: Diagnosis not present

## 2017-10-01 ENCOUNTER — Emergency Department (HOSPITAL_BASED_OUTPATIENT_CLINIC_OR_DEPARTMENT_OTHER)
Admission: EM | Admit: 2017-10-01 | Discharge: 2017-10-01 | Disposition: A | Discharge status: Home / Self Care | Payer: Medicare Other | Attending: Emergency Medicine | Admitting: Emergency Medicine

## 2017-10-01 ENCOUNTER — Other Ambulatory Visit: Payer: Self-pay

## 2017-10-01 ENCOUNTER — Encounter (HOSPITAL_BASED_OUTPATIENT_CLINIC_OR_DEPARTMENT_OTHER): Payer: Self-pay

## 2017-10-01 DIAGNOSIS — Z79899 Other long term (current) drug therapy: Secondary | ICD-10-CM | POA: Insufficient documentation

## 2017-10-01 DIAGNOSIS — F0390 Unspecified dementia without behavioral disturbance: Secondary | ICD-10-CM | POA: Diagnosis not present

## 2017-10-01 DIAGNOSIS — Z7982 Long term (current) use of aspirin: Secondary | ICD-10-CM | POA: Diagnosis not present

## 2017-10-01 DIAGNOSIS — R21 Rash and other nonspecific skin eruption: Secondary | ICD-10-CM | POA: Diagnosis not present

## 2017-10-01 DIAGNOSIS — I10 Essential (primary) hypertension: Secondary | ICD-10-CM | POA: Diagnosis not present

## 2017-10-01 MED ORDER — HYDROXYZINE HCL 25 MG PO TABS: 25.0000 mg | ORAL_TABLET | Freq: Four times a day (QID) | ORAL | 0 refills | Status: DC

## 2017-10-01 MED FILL — hydrOXYzine HCL 25 MG TABS: 25 | 3 days supply | Qty: 12 | Fill #0

## 2017-10-01 NOTE — ED Triage Notes (Signed)
Pt with rash to face-started yesterday-used hydrocortisone and benadryl with no relief-pt NAD-to triage in w/c

## 2017-10-01 NOTE — ED Provider Notes (Signed)
[facility] DEPARTMENT Provider Note   CSN: [id number] Arrival date & time: 10/01/17  1326     History   Chief Complaint Chief Complaint  Patient presents with  . Rash    HPI Candice Horne is a 84 y.o. female with history of dementia, hypertension, hyperlipidemia is here for evaluation of rash.  Onset yesterday, sudden, slowly progressing.  Localized to entire face, this morning it was on her chin.  Described as bumpy and itchy.  States last night she could barely sleep due to the persistent itching.  Daughter states patient sat outside under the sun the day before this began.  They have tried Benadryl, hydrocortisone cream and ice which help with the itching but this morning they noticed the rash was spreading to her lower face.  She denies sick contacts with similar symptoms.  No recent topical agents or changes in soaps, lotions, detergents.  No new exposures to animals.  No new medications.  No recent travel.  Denies associated fevers, shortness of breath, lip tongue or throat swelling, difficulty breathing, chest pain, shortness of breath, abdominal pain, diarrhea.  She does not have any known allergies.  HPI  Past Medical History:  Diagnosis Date  . Allergic rhinitis   . Dementia   . Hyperlipidemia   . Hypertension   . Memory loss   . Osteoarthritis of both knees     There are no active problems to display for this patient.   Past Surgical History:  Procedure Laterality Date  . CHOLECYSTECTOMY    . TONSILLECTOMY       OB History   None      Home Medications    Prior to Admission medications   Medication Sig Start Date End Date Taking? Authorizing Provider  aspirin 81 MG tablet Take 81 mg by mouth daily.    [provider]  b complex vitamins tablet Take 1 tablet by mouth daily.    [provider]  bacitracin ointment Apply 1 application topically 2 (two) times daily. 03/31/17   Drenda Freeze, MD  cholecalciferol  (VITAMIN D) 400 UNITS TABS tablet Take 400 Units by mouth.    [provider]  donepezil (ARICEPT) 10 MG tablet Take 10 mg by mouth at bedtime.    [provider]  doxycycline (VIBRAMYCIN) 100 MG capsule Take 1 capsule (100 mg total) by mouth 2 (two) times daily. One po bid x 7 days 03/31/17   Drenda Freeze, MD  Glucosamine-MSM-Hyaluronic Acd (JOINT HEALTH PO) Take by mouth.    [provider]  hydrochlorothiazide (HYDRODIURIL) 25 MG tablet Take 12.5 mg by mouth. Monday, Wed, Friday    [provider]  hydrOXYzine (ATARAX/VISTARIL) 25 MG tablet Take 1 tablet (25 mg total) by mouth every 6 (six) hours. 10/01/17   Kinnie Feil, PA-C  losartan (COZAAR) 100 MG tablet Take 100 mg by mouth daily.    [provider]  memantine (NAMENDA) 10 MG tablet Take 10 mg by mouth 2 (two) times daily.    [provider]  Omega-3 Fatty Acids (FISH OIL) 1000 MG CAPS Take by mouth.    [provider]  risedronate (ACTONEL) 35 MG tablet Take 35 mg by mouth every 7 (seven) days. with water on empty stomach, nothing by mouth or lie down for next 30 minutes.    [provider]  simvastatin (ZOCOR) 20 MG tablet Take 20 mg by mouth daily.    [provider]  Family History Family History  Problem Relation Age of Onset  . Heart attack Mother   . Cancer Father     Social History Social History   Tobacco Use  . Smoking status: Never Smoker  . Smokeless tobacco: Never Used  Substance Use Topics  . Alcohol use: Yes    Comment: occ  . Drug use: No     Allergies   Patient has no known allergies.   Review of Systems Review of Systems  Skin: Positive for rash.  All other systems reviewed and are negative.    Physical Exam Updated Vital Signs BP (!) 129/59 (BP Location: Right Arm)   Pulse 83   Temp 98.4 F (36.9 C) (Oral)   Resp 18   Ht 4\' 11"  (1.499 m)   Wt 54.4 kg   SpO2 98%   BMI 24.24 kg/m   Physical  Exam  Constitutional: She is oriented to person, place, and time. She appears well-developed and well-nourished. No distress.  NAD. Pleasant.   HENT:  Head: Normocephalic and atraumatic.  Right Ear: External ear normal.  Left Ear: External ear normal.  Nose: Nose normal.  No facial, lip or oropharyngeal edema.  Widely patent oropharynx.  No perioral or intraoral lesions.  Eyes: Conjunctivae and EOM are normal.  Neck: Normal range of motion. Neck supple.  Cardiovascular: Normal rate and regular rhythm.  Pulmonary/Chest: Effort normal and breath sounds normal.  Musculoskeletal: Normal range of motion. She exhibits no deformity.  Neurological: She is alert and oriented to person, place, and time.  Skin: Skin is warm and dry. Capillary refill takes less than 2 seconds. No rash noted.  Small, scattered erythematous, dry, nontender raised lesions/nodules to bilateral cheeks and chin.  Excoriations noted.  Patient is actively itching during exam.  No warmth, fluctuance.  No rash noted to thorax, upper or lower extremities.  Psychiatric: She has a normal mood and affect. Her behavior is normal. Judgment and thought content normal.  Nursing note and vitals reviewed.    ED Treatments / Results  Labs (all labs ordered are listed, but only abnormal results are displayed) Labs Reviewed - No data to display  EKG None  Radiology No results found.  Procedures Procedures (including critical care time)  Medications Ordered in ED Medications - No data to display   Initial Impression / Assessment and Plan / ED Course  I have reviewed the triage vital signs and the nursing notes.  Pertinent labs & imaging results that were available during my care of the patient were reviewed by me and considered in my medical decision making (see chart for details).      84 year old presents with raised, erythematous, pruritic rash.  No facial or oral angioedema.  No respiratory compromise.  Possible  recent exposure to allergen.  Denies recent new topical/hygiene products.  No new medications.  No evidence of insect or animal bite wounds.  No fluctuance, induration, tenderness to suggest abscess or cellulitis.  Clinical exam is not suspicious for dermatological emergency such as EM, SJS or meningococcemia.  Patient is otherwise at baseline.  Suspect urticaria secondary to environmental allergen.  Will discharge with hydroxyzine, loratadine, ice.  I do not think systemic steroids indicated today as the rash is localized to face only. Strict ED return precautions given, pt and daughter is aware of red flag symptoms that would warrant return to ED for further evaluation and tx.     Final Clinical Impressions(s) / ED Diagnoses   Final diagnoses:  Rash    ED Discharge Orders         Ordered    hydrOXYzine (ATARAX/VISTARIL) 25 MG tablet  Every 6 hours     10/01/17 1435           Arlean Hopping 10/01/17 1437    Davonna Belling, MD 10/01/17 [phone]

## 2017-10-01 NOTE — Discharge Instructions (Signed)
You are seen in the ER for a rash.  I suspect this is due to some possible environmental allergen.  We will treat the symptoms with hydroxyzine every 6 hours.  Additionally, take any over-the-counter allergy medication such as loratadine or cetirizine daily.  Apply hydrocortisone cream or calamine lotion or Benadryl cream, ice.  Monitor for signs of superimposed infection including pain, warmth, discharge, fevers.  Return to ER for worsening or expanding rash, associated facial lip or throat swelling, shortness of breath, chest pain, shortness of breath, abdominal pain, diarrhea.

## 2018-01-25 DIAGNOSIS — I10 Essential (primary) hypertension: Secondary | ICD-10-CM | POA: Diagnosis not present

## 2018-01-25 DIAGNOSIS — Z23 Encounter for immunization: Secondary | ICD-10-CM | POA: Diagnosis not present

## 2018-07-30 DIAGNOSIS — M810 Age-related osteoporosis without current pathological fracture: Secondary | ICD-10-CM | POA: Diagnosis not present

## 2018-07-30 DIAGNOSIS — I10 Essential (primary) hypertension: Secondary | ICD-10-CM | POA: Diagnosis not present

## 2018-07-30 DIAGNOSIS — E7800 Pure hypercholesterolemia, unspecified: Secondary | ICD-10-CM | POA: Diagnosis not present

## 2018-08-03 DIAGNOSIS — R32 Unspecified urinary incontinence: Secondary | ICD-10-CM | POA: Diagnosis not present

## 2018-08-03 DIAGNOSIS — C44311 Basal cell carcinoma of skin of nose: Secondary | ICD-10-CM | POA: Diagnosis not present

## 2018-08-03 DIAGNOSIS — J309 Allergic rhinitis, unspecified: Secondary | ICD-10-CM | POA: Diagnosis not present

## 2018-08-03 DIAGNOSIS — M170 Bilateral primary osteoarthritis of knee: Secondary | ICD-10-CM | POA: Diagnosis not present

## 2018-08-03 DIAGNOSIS — I10 Essential (primary) hypertension: Secondary | ICD-10-CM | POA: Diagnosis not present

## 2018-08-03 DIAGNOSIS — Z0000 Encounter for general adult medical examination without abnormal findings: Secondary | ICD-10-CM | POA: Diagnosis not present

## 2018-08-03 DIAGNOSIS — N183 Chronic kidney disease, stage 3 (moderate): Secondary | ICD-10-CM | POA: Diagnosis not present

## 2018-08-03 DIAGNOSIS — E7800 Pure hypercholesterolemia, unspecified: Secondary | ICD-10-CM | POA: Diagnosis not present

## 2018-08-13 DIAGNOSIS — C44311 Basal cell carcinoma of skin of nose: Secondary | ICD-10-CM | POA: Diagnosis not present

## 2018-08-13 DIAGNOSIS — C44319 Basal cell carcinoma of skin of other parts of face: Secondary | ICD-10-CM | POA: Diagnosis not present

## 2018-09-02 ENCOUNTER — Other Ambulatory Visit: Payer: Self-pay

## 2018-12-29 ENCOUNTER — Other Ambulatory Visit: Payer: Self-pay

## 2019-08-09 DIAGNOSIS — N183 Chronic kidney disease, stage 3 unspecified: Secondary | ICD-10-CM | POA: Diagnosis not present

## 2019-08-09 DIAGNOSIS — R413 Other amnesia: Secondary | ICD-10-CM | POA: Diagnosis not present

## 2019-08-09 DIAGNOSIS — R32 Unspecified urinary incontinence: Secondary | ICD-10-CM | POA: Diagnosis not present

## 2019-08-09 DIAGNOSIS — I10 Essential (primary) hypertension: Secondary | ICD-10-CM | POA: Diagnosis not present

## 2019-08-09 DIAGNOSIS — Z0001 Encounter for general adult medical examination with abnormal findings: Secondary | ICD-10-CM | POA: Diagnosis not present

## 2019-08-09 DIAGNOSIS — H9313 Tinnitus, bilateral: Secondary | ICD-10-CM | POA: Diagnosis not present

## 2019-08-09 DIAGNOSIS — M810 Age-related osteoporosis without current pathological fracture: Secondary | ICD-10-CM | POA: Diagnosis not present

## 2019-08-09 DIAGNOSIS — M170 Bilateral primary osteoarthritis of knee: Secondary | ICD-10-CM | POA: Diagnosis not present

## 2019-08-09 DIAGNOSIS — J309 Allergic rhinitis, unspecified: Secondary | ICD-10-CM | POA: Diagnosis not present

## 2019-08-09 DIAGNOSIS — E7800 Pure hypercholesterolemia, unspecified: Secondary | ICD-10-CM | POA: Diagnosis not present

## 2019-09-21 ENCOUNTER — Telehealth: Payer: Self-pay

## 2019-09-21 NOTE — Telephone Encounter (Signed)
Telephone call to patient to schedule palliative care visit with patient. SW left a message requesting a call back.

## 2019-10-07 DIAGNOSIS — N183 Chronic kidney disease, stage 3 unspecified: Secondary | ICD-10-CM | POA: Diagnosis not present

## 2019-10-07 DIAGNOSIS — R2681 Unsteadiness on feet: Secondary | ICD-10-CM | POA: Diagnosis not present

## 2019-10-07 DIAGNOSIS — I129 Hypertensive chronic kidney disease with stage 1 through stage 4 chronic kidney disease, or unspecified chronic kidney disease: Secondary | ICD-10-CM | POA: Diagnosis not present

## 2019-10-07 DIAGNOSIS — Z9181 History of falling: Secondary | ICD-10-CM | POA: Diagnosis not present

## 2019-10-07 DIAGNOSIS — E559 Vitamin D deficiency, unspecified: Secondary | ICD-10-CM | POA: Diagnosis not present

## 2019-10-07 DIAGNOSIS — M170 Bilateral primary osteoarthritis of knee: Secondary | ICD-10-CM | POA: Diagnosis not present

## 2019-10-07 DIAGNOSIS — M810 Age-related osteoporosis without current pathological fracture: Secondary | ICD-10-CM | POA: Diagnosis not present

## 2019-10-07 DIAGNOSIS — R413 Other amnesia: Secondary | ICD-10-CM | POA: Diagnosis not present

## 2019-10-07 DIAGNOSIS — Z9049 Acquired absence of other specified parts of digestive tract: Secondary | ICD-10-CM | POA: Diagnosis not present

## 2019-10-13 DIAGNOSIS — I129 Hypertensive chronic kidney disease with stage 1 through stage 4 chronic kidney disease, or unspecified chronic kidney disease: Secondary | ICD-10-CM | POA: Diagnosis not present

## 2019-10-13 DIAGNOSIS — M170 Bilateral primary osteoarthritis of knee: Secondary | ICD-10-CM | POA: Diagnosis not present

## 2019-10-13 DIAGNOSIS — N183 Chronic kidney disease, stage 3 unspecified: Secondary | ICD-10-CM | POA: Diagnosis not present

## 2019-10-13 DIAGNOSIS — R413 Other amnesia: Secondary | ICD-10-CM | POA: Diagnosis not present

## 2019-10-13 DIAGNOSIS — M810 Age-related osteoporosis without current pathological fracture: Secondary | ICD-10-CM | POA: Diagnosis not present

## 2019-10-13 DIAGNOSIS — R2681 Unsteadiness on feet: Secondary | ICD-10-CM | POA: Diagnosis not present

## 2019-10-15 DIAGNOSIS — M170 Bilateral primary osteoarthritis of knee: Secondary | ICD-10-CM | POA: Diagnosis not present

## 2019-10-15 DIAGNOSIS — M810 Age-related osteoporosis without current pathological fracture: Secondary | ICD-10-CM | POA: Diagnosis not present

## 2019-10-15 DIAGNOSIS — N183 Chronic kidney disease, stage 3 unspecified: Secondary | ICD-10-CM | POA: Diagnosis not present

## 2019-10-15 DIAGNOSIS — R413 Other amnesia: Secondary | ICD-10-CM | POA: Diagnosis not present

## 2019-10-15 DIAGNOSIS — I129 Hypertensive chronic kidney disease with stage 1 through stage 4 chronic kidney disease, or unspecified chronic kidney disease: Secondary | ICD-10-CM | POA: Diagnosis not present

## 2019-10-15 DIAGNOSIS — R2681 Unsteadiness on feet: Secondary | ICD-10-CM | POA: Diagnosis not present

## 2019-10-19 DIAGNOSIS — M170 Bilateral primary osteoarthritis of knee: Secondary | ICD-10-CM | POA: Diagnosis not present

## 2019-10-19 DIAGNOSIS — I129 Hypertensive chronic kidney disease with stage 1 through stage 4 chronic kidney disease, or unspecified chronic kidney disease: Secondary | ICD-10-CM | POA: Diagnosis not present

## 2019-10-19 DIAGNOSIS — M810 Age-related osteoporosis without current pathological fracture: Secondary | ICD-10-CM | POA: Diagnosis not present

## 2019-10-19 DIAGNOSIS — N183 Chronic kidney disease, stage 3 unspecified: Secondary | ICD-10-CM | POA: Diagnosis not present

## 2019-10-19 DIAGNOSIS — R2681 Unsteadiness on feet: Secondary | ICD-10-CM | POA: Diagnosis not present

## 2019-10-19 DIAGNOSIS — R413 Other amnesia: Secondary | ICD-10-CM | POA: Diagnosis not present

## 2019-10-21 DIAGNOSIS — R2681 Unsteadiness on feet: Secondary | ICD-10-CM | POA: Diagnosis not present

## 2019-10-21 DIAGNOSIS — N183 Chronic kidney disease, stage 3 unspecified: Secondary | ICD-10-CM | POA: Diagnosis not present

## 2019-10-21 DIAGNOSIS — M810 Age-related osteoporosis without current pathological fracture: Secondary | ICD-10-CM | POA: Diagnosis not present

## 2019-10-21 DIAGNOSIS — M170 Bilateral primary osteoarthritis of knee: Secondary | ICD-10-CM | POA: Diagnosis not present

## 2019-10-21 DIAGNOSIS — R413 Other amnesia: Secondary | ICD-10-CM | POA: Diagnosis not present

## 2019-10-21 DIAGNOSIS — I129 Hypertensive chronic kidney disease with stage 1 through stage 4 chronic kidney disease, or unspecified chronic kidney disease: Secondary | ICD-10-CM | POA: Diagnosis not present

## 2019-10-24 ENCOUNTER — Telehealth: Payer: Self-pay

## 2019-10-24 NOTE — Telephone Encounter (Signed)
(  2:50p) SW left a message for patient's daughter to schedule a palliative initial visit. SW received her voicemail and requested a call back.

## 2019-10-26 DIAGNOSIS — N183 Chronic kidney disease, stage 3 unspecified: Secondary | ICD-10-CM | POA: Diagnosis not present

## 2019-10-26 DIAGNOSIS — M170 Bilateral primary osteoarthritis of knee: Secondary | ICD-10-CM | POA: Diagnosis not present

## 2019-10-26 DIAGNOSIS — R2681 Unsteadiness on feet: Secondary | ICD-10-CM | POA: Diagnosis not present

## 2019-10-26 DIAGNOSIS — M810 Age-related osteoporosis without current pathological fracture: Secondary | ICD-10-CM | POA: Diagnosis not present

## 2019-10-26 DIAGNOSIS — I129 Hypertensive chronic kidney disease with stage 1 through stage 4 chronic kidney disease, or unspecified chronic kidney disease: Secondary | ICD-10-CM | POA: Diagnosis not present

## 2019-10-26 DIAGNOSIS — R413 Other amnesia: Secondary | ICD-10-CM | POA: Diagnosis not present

## 2019-10-27 DIAGNOSIS — M170 Bilateral primary osteoarthritis of knee: Secondary | ICD-10-CM | POA: Diagnosis not present

## 2019-10-27 DIAGNOSIS — I129 Hypertensive chronic kidney disease with stage 1 through stage 4 chronic kidney disease, or unspecified chronic kidney disease: Secondary | ICD-10-CM | POA: Diagnosis not present

## 2019-10-27 DIAGNOSIS — R2681 Unsteadiness on feet: Secondary | ICD-10-CM | POA: Diagnosis not present

## 2019-10-27 DIAGNOSIS — R413 Other amnesia: Secondary | ICD-10-CM | POA: Diagnosis not present

## 2019-10-28 DIAGNOSIS — R2681 Unsteadiness on feet: Secondary | ICD-10-CM | POA: Diagnosis not present

## 2019-10-28 DIAGNOSIS — M810 Age-related osteoporosis without current pathological fracture: Secondary | ICD-10-CM | POA: Diagnosis not present

## 2019-10-28 DIAGNOSIS — I129 Hypertensive chronic kidney disease with stage 1 through stage 4 chronic kidney disease, or unspecified chronic kidney disease: Secondary | ICD-10-CM | POA: Diagnosis not present

## 2019-10-28 DIAGNOSIS — N183 Chronic kidney disease, stage 3 unspecified: Secondary | ICD-10-CM | POA: Diagnosis not present

## 2019-10-28 DIAGNOSIS — M170 Bilateral primary osteoarthritis of knee: Secondary | ICD-10-CM | POA: Diagnosis not present

## 2019-10-28 DIAGNOSIS — R413 Other amnesia: Secondary | ICD-10-CM | POA: Diagnosis not present

## 2019-11-01 DIAGNOSIS — M810 Age-related osteoporosis without current pathological fracture: Secondary | ICD-10-CM | POA: Diagnosis not present

## 2019-11-01 DIAGNOSIS — R413 Other amnesia: Secondary | ICD-10-CM | POA: Diagnosis not present

## 2019-11-01 DIAGNOSIS — R2681 Unsteadiness on feet: Secondary | ICD-10-CM | POA: Diagnosis not present

## 2019-11-01 DIAGNOSIS — N183 Chronic kidney disease, stage 3 unspecified: Secondary | ICD-10-CM | POA: Diagnosis not present

## 2019-11-01 DIAGNOSIS — M170 Bilateral primary osteoarthritis of knee: Secondary | ICD-10-CM | POA: Diagnosis not present

## 2019-11-01 DIAGNOSIS — I129 Hypertensive chronic kidney disease with stage 1 through stage 4 chronic kidney disease, or unspecified chronic kidney disease: Secondary | ICD-10-CM | POA: Diagnosis not present

## 2019-11-02 DIAGNOSIS — M170 Bilateral primary osteoarthritis of knee: Secondary | ICD-10-CM | POA: Diagnosis not present

## 2019-11-02 DIAGNOSIS — R2681 Unsteadiness on feet: Secondary | ICD-10-CM | POA: Diagnosis not present

## 2019-11-02 DIAGNOSIS — M810 Age-related osteoporosis without current pathological fracture: Secondary | ICD-10-CM | POA: Diagnosis not present

## 2019-11-02 DIAGNOSIS — R413 Other amnesia: Secondary | ICD-10-CM | POA: Diagnosis not present

## 2019-11-02 DIAGNOSIS — I129 Hypertensive chronic kidney disease with stage 1 through stage 4 chronic kidney disease, or unspecified chronic kidney disease: Secondary | ICD-10-CM | POA: Diagnosis not present

## 2019-11-02 DIAGNOSIS — N183 Chronic kidney disease, stage 3 unspecified: Secondary | ICD-10-CM | POA: Diagnosis not present

## 2019-11-06 DIAGNOSIS — M810 Age-related osteoporosis without current pathological fracture: Secondary | ICD-10-CM | POA: Diagnosis not present

## 2019-11-06 DIAGNOSIS — M170 Bilateral primary osteoarthritis of knee: Secondary | ICD-10-CM | POA: Diagnosis not present

## 2019-11-06 DIAGNOSIS — R2681 Unsteadiness on feet: Secondary | ICD-10-CM | POA: Diagnosis not present

## 2019-11-06 DIAGNOSIS — Z9049 Acquired absence of other specified parts of digestive tract: Secondary | ICD-10-CM | POA: Diagnosis not present

## 2019-11-06 DIAGNOSIS — R413 Other amnesia: Secondary | ICD-10-CM | POA: Diagnosis not present

## 2019-11-06 DIAGNOSIS — I129 Hypertensive chronic kidney disease with stage 1 through stage 4 chronic kidney disease, or unspecified chronic kidney disease: Secondary | ICD-10-CM | POA: Diagnosis not present

## 2019-11-06 DIAGNOSIS — N183 Chronic kidney disease, stage 3 unspecified: Secondary | ICD-10-CM | POA: Diagnosis not present

## 2019-11-06 DIAGNOSIS — Z9181 History of falling: Secondary | ICD-10-CM | POA: Diagnosis not present

## 2019-11-06 DIAGNOSIS — E559 Vitamin D deficiency, unspecified: Secondary | ICD-10-CM | POA: Diagnosis not present

## 2019-11-09 DIAGNOSIS — R413 Other amnesia: Secondary | ICD-10-CM | POA: Diagnosis not present

## 2019-11-09 DIAGNOSIS — R2681 Unsteadiness on feet: Secondary | ICD-10-CM | POA: Diagnosis not present

## 2019-11-09 DIAGNOSIS — M170 Bilateral primary osteoarthritis of knee: Secondary | ICD-10-CM | POA: Diagnosis not present

## 2019-11-09 DIAGNOSIS — N183 Chronic kidney disease, stage 3 unspecified: Secondary | ICD-10-CM | POA: Diagnosis not present

## 2019-11-09 DIAGNOSIS — I129 Hypertensive chronic kidney disease with stage 1 through stage 4 chronic kidney disease, or unspecified chronic kidney disease: Secondary | ICD-10-CM | POA: Diagnosis not present

## 2019-11-09 DIAGNOSIS — M810 Age-related osteoporosis without current pathological fracture: Secondary | ICD-10-CM | POA: Diagnosis not present

## 2019-11-11 DIAGNOSIS — I129 Hypertensive chronic kidney disease with stage 1 through stage 4 chronic kidney disease, or unspecified chronic kidney disease: Secondary | ICD-10-CM | POA: Diagnosis not present

## 2019-11-11 DIAGNOSIS — M170 Bilateral primary osteoarthritis of knee: Secondary | ICD-10-CM | POA: Diagnosis not present

## 2019-11-11 DIAGNOSIS — R2681 Unsteadiness on feet: Secondary | ICD-10-CM | POA: Diagnosis not present

## 2019-11-11 DIAGNOSIS — M810 Age-related osteoporosis without current pathological fracture: Secondary | ICD-10-CM | POA: Diagnosis not present

## 2019-11-11 DIAGNOSIS — R413 Other amnesia: Secondary | ICD-10-CM | POA: Diagnosis not present

## 2019-11-11 DIAGNOSIS — N183 Chronic kidney disease, stage 3 unspecified: Secondary | ICD-10-CM | POA: Diagnosis not present

## 2019-11-16 DIAGNOSIS — M810 Age-related osteoporosis without current pathological fracture: Secondary | ICD-10-CM | POA: Diagnosis not present

## 2019-11-16 DIAGNOSIS — I129 Hypertensive chronic kidney disease with stage 1 through stage 4 chronic kidney disease, or unspecified chronic kidney disease: Secondary | ICD-10-CM | POA: Diagnosis not present

## 2019-11-16 DIAGNOSIS — N183 Chronic kidney disease, stage 3 unspecified: Secondary | ICD-10-CM | POA: Diagnosis not present

## 2019-11-16 DIAGNOSIS — R413 Other amnesia: Secondary | ICD-10-CM | POA: Diagnosis not present

## 2019-11-16 DIAGNOSIS — M170 Bilateral primary osteoarthritis of knee: Secondary | ICD-10-CM | POA: Diagnosis not present

## 2019-11-16 DIAGNOSIS — R2681 Unsteadiness on feet: Secondary | ICD-10-CM | POA: Diagnosis not present

## 2019-11-18 DIAGNOSIS — R2681 Unsteadiness on feet: Secondary | ICD-10-CM | POA: Diagnosis not present

## 2019-11-18 DIAGNOSIS — I129 Hypertensive chronic kidney disease with stage 1 through stage 4 chronic kidney disease, or unspecified chronic kidney disease: Secondary | ICD-10-CM | POA: Diagnosis not present

## 2019-11-18 DIAGNOSIS — M170 Bilateral primary osteoarthritis of knee: Secondary | ICD-10-CM | POA: Diagnosis not present

## 2019-11-18 DIAGNOSIS — N183 Chronic kidney disease, stage 3 unspecified: Secondary | ICD-10-CM | POA: Diagnosis not present

## 2019-11-18 DIAGNOSIS — M810 Age-related osteoporosis without current pathological fracture: Secondary | ICD-10-CM | POA: Diagnosis not present

## 2019-11-18 DIAGNOSIS — R413 Other amnesia: Secondary | ICD-10-CM | POA: Diagnosis not present

## 2019-11-23 DIAGNOSIS — R2681 Unsteadiness on feet: Secondary | ICD-10-CM | POA: Diagnosis not present

## 2019-11-23 DIAGNOSIS — M810 Age-related osteoporosis without current pathological fracture: Secondary | ICD-10-CM | POA: Diagnosis not present

## 2019-11-23 DIAGNOSIS — R413 Other amnesia: Secondary | ICD-10-CM | POA: Diagnosis not present

## 2019-11-23 DIAGNOSIS — M170 Bilateral primary osteoarthritis of knee: Secondary | ICD-10-CM | POA: Diagnosis not present

## 2019-11-23 DIAGNOSIS — N183 Chronic kidney disease, stage 3 unspecified: Secondary | ICD-10-CM | POA: Diagnosis not present

## 2019-11-23 DIAGNOSIS — I129 Hypertensive chronic kidney disease with stage 1 through stage 4 chronic kidney disease, or unspecified chronic kidney disease: Secondary | ICD-10-CM | POA: Diagnosis not present

## 2019-12-05 DIAGNOSIS — R2681 Unsteadiness on feet: Secondary | ICD-10-CM | POA: Diagnosis not present

## 2019-12-05 DIAGNOSIS — N183 Chronic kidney disease, stage 3 unspecified: Secondary | ICD-10-CM | POA: Diagnosis not present

## 2019-12-05 DIAGNOSIS — I129 Hypertensive chronic kidney disease with stage 1 through stage 4 chronic kidney disease, or unspecified chronic kidney disease: Secondary | ICD-10-CM | POA: Diagnosis not present

## 2019-12-05 DIAGNOSIS — M170 Bilateral primary osteoarthritis of knee: Secondary | ICD-10-CM | POA: Diagnosis not present

## 2019-12-05 DIAGNOSIS — R413 Other amnesia: Secondary | ICD-10-CM | POA: Diagnosis not present

## 2019-12-05 DIAGNOSIS — M810 Age-related osteoporosis without current pathological fracture: Secondary | ICD-10-CM | POA: Diagnosis not present

## 2020-02-08 DIAGNOSIS — Z23 Encounter for immunization: Secondary | ICD-10-CM | POA: Diagnosis not present

## 2020-06-05 DIAGNOSIS — R2689 Other abnormalities of gait and mobility: Secondary | ICD-10-CM | POA: Diagnosis not present

## 2020-06-05 DIAGNOSIS — I10 Essential (primary) hypertension: Secondary | ICD-10-CM | POA: Diagnosis not present

## 2020-06-05 DIAGNOSIS — G4720 Circadian rhythm sleep disorder, unspecified type: Secondary | ICD-10-CM | POA: Diagnosis not present

## 2020-06-05 DIAGNOSIS — M25561 Pain in right knee: Secondary | ICD-10-CM | POA: Diagnosis not present

## 2020-06-05 DIAGNOSIS — G8929 Other chronic pain: Secondary | ICD-10-CM | POA: Diagnosis not present

## 2020-06-05 DIAGNOSIS — M25562 Pain in left knee: Secondary | ICD-10-CM | POA: Diagnosis not present

## 2020-06-13 DIAGNOSIS — R2689 Other abnormalities of gait and mobility: Secondary | ICD-10-CM | POA: Diagnosis not present

## 2020-06-13 DIAGNOSIS — Z9181 History of falling: Secondary | ICD-10-CM | POA: Diagnosis not present

## 2020-06-13 DIAGNOSIS — I10 Essential (primary) hypertension: Secondary | ICD-10-CM | POA: Diagnosis not present

## 2020-06-13 DIAGNOSIS — K5730 Diverticulosis of large intestine without perforation or abscess without bleeding: Secondary | ICD-10-CM | POA: Diagnosis not present

## 2020-06-13 DIAGNOSIS — E559 Vitamin D deficiency, unspecified: Secondary | ICD-10-CM | POA: Diagnosis not present

## 2020-06-13 DIAGNOSIS — M810 Age-related osteoporosis without current pathological fracture: Secondary | ICD-10-CM | POA: Diagnosis not present

## 2020-06-13 DIAGNOSIS — G4720 Circadian rhythm sleep disorder, unspecified type: Secondary | ICD-10-CM | POA: Diagnosis not present

## 2020-06-13 DIAGNOSIS — M170 Bilateral primary osteoarthritis of knee: Secondary | ICD-10-CM | POA: Diagnosis not present

## 2020-06-13 DIAGNOSIS — E7800 Pure hypercholesterolemia, unspecified: Secondary | ICD-10-CM | POA: Diagnosis not present

## 2020-06-13 DIAGNOSIS — G8929 Other chronic pain: Secondary | ICD-10-CM | POA: Diagnosis not present

## 2020-06-13 DIAGNOSIS — R413 Other amnesia: Secondary | ICD-10-CM | POA: Diagnosis not present

## 2020-06-25 DIAGNOSIS — G4720 Circadian rhythm sleep disorder, unspecified type: Secondary | ICD-10-CM | POA: Diagnosis not present

## 2020-06-25 DIAGNOSIS — R413 Other amnesia: Secondary | ICD-10-CM | POA: Diagnosis not present

## 2020-06-25 DIAGNOSIS — R2689 Other abnormalities of gait and mobility: Secondary | ICD-10-CM | POA: Diagnosis not present

## 2020-06-25 DIAGNOSIS — I10 Essential (primary) hypertension: Secondary | ICD-10-CM | POA: Diagnosis not present

## 2020-06-25 DIAGNOSIS — G8929 Other chronic pain: Secondary | ICD-10-CM | POA: Diagnosis not present

## 2020-06-25 DIAGNOSIS — M170 Bilateral primary osteoarthritis of knee: Secondary | ICD-10-CM | POA: Diagnosis not present

## 2020-06-27 DIAGNOSIS — M170 Bilateral primary osteoarthritis of knee: Secondary | ICD-10-CM | POA: Diagnosis not present

## 2020-06-27 DIAGNOSIS — G8929 Other chronic pain: Secondary | ICD-10-CM | POA: Diagnosis not present

## 2020-06-27 DIAGNOSIS — G4720 Circadian rhythm sleep disorder, unspecified type: Secondary | ICD-10-CM | POA: Diagnosis not present

## 2020-06-27 DIAGNOSIS — R413 Other amnesia: Secondary | ICD-10-CM | POA: Diagnosis not present

## 2020-06-27 DIAGNOSIS — I10 Essential (primary) hypertension: Secondary | ICD-10-CM | POA: Diagnosis not present

## 2020-06-27 DIAGNOSIS — R2689 Other abnormalities of gait and mobility: Secondary | ICD-10-CM | POA: Diagnosis not present

## 2020-06-28 DIAGNOSIS — R2689 Other abnormalities of gait and mobility: Secondary | ICD-10-CM | POA: Diagnosis not present

## 2020-06-28 DIAGNOSIS — I10 Essential (primary) hypertension: Secondary | ICD-10-CM | POA: Diagnosis not present

## 2020-06-28 DIAGNOSIS — R413 Other amnesia: Secondary | ICD-10-CM | POA: Diagnosis not present

## 2020-06-28 DIAGNOSIS — M170 Bilateral primary osteoarthritis of knee: Secondary | ICD-10-CM | POA: Diagnosis not present

## 2020-07-09 DIAGNOSIS — R413 Other amnesia: Secondary | ICD-10-CM | POA: Diagnosis not present

## 2020-07-09 DIAGNOSIS — G8929 Other chronic pain: Secondary | ICD-10-CM | POA: Diagnosis not present

## 2020-07-09 DIAGNOSIS — M170 Bilateral primary osteoarthritis of knee: Secondary | ICD-10-CM | POA: Diagnosis not present

## 2020-07-09 DIAGNOSIS — R2689 Other abnormalities of gait and mobility: Secondary | ICD-10-CM | POA: Diagnosis not present

## 2020-07-09 DIAGNOSIS — G4720 Circadian rhythm sleep disorder, unspecified type: Secondary | ICD-10-CM | POA: Diagnosis not present

## 2020-07-09 DIAGNOSIS — I10 Essential (primary) hypertension: Secondary | ICD-10-CM | POA: Diagnosis not present

## 2020-07-12 DIAGNOSIS — R2689 Other abnormalities of gait and mobility: Secondary | ICD-10-CM | POA: Diagnosis not present

## 2020-07-12 DIAGNOSIS — R413 Other amnesia: Secondary | ICD-10-CM | POA: Diagnosis not present

## 2020-07-12 DIAGNOSIS — I10 Essential (primary) hypertension: Secondary | ICD-10-CM | POA: Diagnosis not present

## 2020-07-12 DIAGNOSIS — M170 Bilateral primary osteoarthritis of knee: Secondary | ICD-10-CM | POA: Diagnosis not present

## 2020-07-12 DIAGNOSIS — G8929 Other chronic pain: Secondary | ICD-10-CM | POA: Diagnosis not present

## 2020-07-12 DIAGNOSIS — G4720 Circadian rhythm sleep disorder, unspecified type: Secondary | ICD-10-CM | POA: Diagnosis not present

## 2020-07-13 DIAGNOSIS — G4720 Circadian rhythm sleep disorder, unspecified type: Secondary | ICD-10-CM | POA: Diagnosis not present

## 2020-07-13 DIAGNOSIS — I10 Essential (primary) hypertension: Secondary | ICD-10-CM | POA: Diagnosis not present

## 2020-07-13 DIAGNOSIS — Z9181 History of falling: Secondary | ICD-10-CM | POA: Diagnosis not present

## 2020-07-13 DIAGNOSIS — E7800 Pure hypercholesterolemia, unspecified: Secondary | ICD-10-CM | POA: Diagnosis not present

## 2020-07-13 DIAGNOSIS — M170 Bilateral primary osteoarthritis of knee: Secondary | ICD-10-CM | POA: Diagnosis not present

## 2020-07-13 DIAGNOSIS — R2689 Other abnormalities of gait and mobility: Secondary | ICD-10-CM | POA: Diagnosis not present

## 2020-07-13 DIAGNOSIS — K5730 Diverticulosis of large intestine without perforation or abscess without bleeding: Secondary | ICD-10-CM | POA: Diagnosis not present

## 2020-07-13 DIAGNOSIS — R413 Other amnesia: Secondary | ICD-10-CM | POA: Diagnosis not present

## 2020-07-13 DIAGNOSIS — E559 Vitamin D deficiency, unspecified: Secondary | ICD-10-CM | POA: Diagnosis not present

## 2020-07-13 DIAGNOSIS — M810 Age-related osteoporosis without current pathological fracture: Secondary | ICD-10-CM | POA: Diagnosis not present

## 2020-07-13 DIAGNOSIS — G8929 Other chronic pain: Secondary | ICD-10-CM | POA: Diagnosis not present

## 2020-07-27 DIAGNOSIS — R2689 Other abnormalities of gait and mobility: Secondary | ICD-10-CM | POA: Diagnosis not present

## 2020-07-27 DIAGNOSIS — I10 Essential (primary) hypertension: Secondary | ICD-10-CM | POA: Diagnosis not present

## 2020-07-27 DIAGNOSIS — M170 Bilateral primary osteoarthritis of knee: Secondary | ICD-10-CM | POA: Diagnosis not present

## 2020-07-27 DIAGNOSIS — G4720 Circadian rhythm sleep disorder, unspecified type: Secondary | ICD-10-CM | POA: Diagnosis not present

## 2020-07-27 DIAGNOSIS — G8929 Other chronic pain: Secondary | ICD-10-CM | POA: Diagnosis not present

## 2020-07-27 DIAGNOSIS — R413 Other amnesia: Secondary | ICD-10-CM | POA: Diagnosis not present

## 2020-08-13 DIAGNOSIS — I10 Essential (primary) hypertension: Secondary | ICD-10-CM | POA: Diagnosis not present

## 2020-08-13 DIAGNOSIS — E7800 Pure hypercholesterolemia, unspecified: Secondary | ICD-10-CM | POA: Diagnosis not present

## 2020-08-13 DIAGNOSIS — M810 Age-related osteoporosis without current pathological fracture: Secondary | ICD-10-CM | POA: Diagnosis not present

## 2020-08-14 DIAGNOSIS — M810 Age-related osteoporosis without current pathological fracture: Secondary | ICD-10-CM | POA: Diagnosis not present

## 2020-08-14 DIAGNOSIS — N1831 Chronic kidney disease, stage 3a: Secondary | ICD-10-CM | POA: Diagnosis not present

## 2020-08-14 DIAGNOSIS — Z0000 Encounter for general adult medical examination without abnormal findings: Secondary | ICD-10-CM | POA: Diagnosis not present

## 2020-08-14 DIAGNOSIS — R413 Other amnesia: Secondary | ICD-10-CM | POA: Diagnosis not present

## 2020-08-14 DIAGNOSIS — I10 Essential (primary) hypertension: Secondary | ICD-10-CM | POA: Diagnosis not present

## 2020-08-14 DIAGNOSIS — R32 Unspecified urinary incontinence: Secondary | ICD-10-CM | POA: Diagnosis not present

## 2020-08-14 DIAGNOSIS — J309 Allergic rhinitis, unspecified: Secondary | ICD-10-CM | POA: Diagnosis not present

## 2020-08-14 DIAGNOSIS — S51812A Laceration without foreign body of left forearm, initial encounter: Secondary | ICD-10-CM | POA: Diagnosis not present

## 2020-08-14 DIAGNOSIS — E7800 Pure hypercholesterolemia, unspecified: Secondary | ICD-10-CM | POA: Diagnosis not present

## 2020-08-14 DIAGNOSIS — F0390 Unspecified dementia without behavioral disturbance: Secondary | ICD-10-CM | POA: Diagnosis not present

## 2021-08-14 DIAGNOSIS — M810 Age-related osteoporosis without current pathological fracture: Secondary | ICD-10-CM | POA: Diagnosis not present

## 2021-08-14 DIAGNOSIS — I10 Essential (primary) hypertension: Secondary | ICD-10-CM | POA: Diagnosis not present

## 2021-08-14 DIAGNOSIS — E7800 Pure hypercholesterolemia, unspecified: Secondary | ICD-10-CM | POA: Diagnosis not present

## 2021-08-24 DIAGNOSIS — R42 Dizziness and giddiness: Secondary | ICD-10-CM | POA: Diagnosis not present

## 2021-08-25 DIAGNOSIS — G8911 Acute pain due to trauma: Secondary | ICD-10-CM | POA: Diagnosis not present

## 2021-08-25 DIAGNOSIS — Z79899 Other long term (current) drug therapy: Secondary | ICD-10-CM | POA: Diagnosis not present

## 2021-08-25 DIAGNOSIS — S0101XA Laceration without foreign body of scalp, initial encounter: Secondary | ICD-10-CM | POA: Diagnosis not present

## 2021-08-25 DIAGNOSIS — S199XXA Unspecified injury of neck, initial encounter: Secondary | ICD-10-CM | POA: Diagnosis not present

## 2021-08-25 DIAGNOSIS — S0990XA Unspecified injury of head, initial encounter: Secondary | ICD-10-CM | POA: Diagnosis not present

## 2021-08-25 DIAGNOSIS — Z23 Encounter for immunization: Secondary | ICD-10-CM | POA: Diagnosis not present

## 2021-08-25 DIAGNOSIS — W19XXXA Unspecified fall, initial encounter: Secondary | ICD-10-CM | POA: Diagnosis not present

## 2021-08-25 DIAGNOSIS — I10 Essential (primary) hypertension: Secondary | ICD-10-CM | POA: Diagnosis not present

## 2021-09-04 DIAGNOSIS — W19XXXD Unspecified fall, subsequent encounter: Secondary | ICD-10-CM | POA: Diagnosis not present

## 2021-09-04 DIAGNOSIS — Z09 Encounter for follow-up examination after completed treatment for conditions other than malignant neoplasm: Secondary | ICD-10-CM | POA: Diagnosis not present

## 2021-09-04 DIAGNOSIS — R42 Dizziness and giddiness: Secondary | ICD-10-CM | POA: Diagnosis not present

## 2021-09-04 DIAGNOSIS — R531 Weakness: Secondary | ICD-10-CM | POA: Diagnosis not present

## 2021-10-02 ENCOUNTER — Ambulatory Visit (INDEPENDENT_AMBULATORY_CARE_PROVIDER_SITE_OTHER): Payer: Medicare Other | Admitting: Neurology

## 2021-10-02 ENCOUNTER — Encounter: Payer: Self-pay | Admitting: Neurology

## 2021-10-02 VITALS — BP 125/63 | HR 78 | Ht 59.0 in | Wt 110.0 lb

## 2021-10-02 DIAGNOSIS — F02C18 Dementia in other diseases classified elsewhere, severe, with other behavioral disturbance: Secondary | ICD-10-CM | POA: Diagnosis not present

## 2021-10-02 DIAGNOSIS — G301 Alzheimer's disease with late onset: Secondary | ICD-10-CM

## 2021-10-02 MED ORDER — CITALOPRAM HYDROBROMIDE 20 MG PO TABS: 20.0000 mg | ORAL_TABLET | Freq: Every day | ORAL | 11 refills | Status: DC

## 2021-10-02 NOTE — Progress Notes (Signed)
[facility]  PATIENT: Candice Horne DOB: 1926-05-09  REQUESTING CLINICIAN: Deon Pilling, NP HISTORY FROM: Daughter  REASON FOR VISIT: Worsening dementia    HISTORICAL  CHIEF COMPLAINT:  Chief Complaint  Patient presents with   New Patient (Initial Visit)    Rm 12. Accompanied by daughter, Clarise Cruz. NP/paper proficient/Katie Hall Busing NP [facility]./Worsening dementia, balance issues - ? Seizure. Requesting sleep medicine. Trazodone is unhelpful.    HISTORY OF PRESENT ILLNESS:  This is a 88 year old woman past medical history of dementia, hypertension who is presenting with worsening dementia.  History was obtained mainly from daughter.  Per daughter, patient was never officially diagnosed but back in 2000 she was started on Aricept and Namenda for memory problem.  She did well until in the past few years when her memory got worse.  Lately she has noted that she has trouble with sleeping, some nights she is unable to sleep, she does have sundowners effect.  At night she may be up all night trying to get out of the house, tried to go "home", at time patient does not recognize her daughter and sometimes she will  try to fight her.  There is also report of hallucinations where she is seeing animals and also seeing people in her room, coming after her.   Daughter also reports that lately she has issue with knowing how to eat, she does not know how to use a fork to eat anymore and does not know how to hold a piece of pizza and eat it.  There was 1 occasion where she tried to get out and wander outside the house.  On July 23 she had a fall and when she got home she had a seizure-like activity where her eyes rolled back her head was jerking and she did not respond lasted about 2-minute.   Patient does live with her daughter who is her caretaker.    TBI:   No past history of TBI Stroke:  no past history of stroke Seizures:   no past history of seizures Sleep:   no history of  sleep apnea.  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 Cooking: Daughter Cleaning: Daughter  Shopping: Daughter Bathing: Daughter  Toileting: Daughter  Driving: No Bills: Daughter  Medications: Daughter gives her her medications  Ever left the stove on by accident?: N/A  Forget how to use items around the house?: yes Getting lost going to familiar places?: yes Forgetting loved ones names?: yes Word finding difficulty? yes Sleep: Ok    OTHER MEDICAL CONDITIONS: Dementia, Hypertension    REVIEW OF SYSTEMS: Full 14 system review of systems performed and negative with exception of: As noted in the HPI   ALLERGIES: Allergies  Allergen Reactions   Pseudoephedrine Hcl     Other reaction(s): palpitations    HOME MEDICATIONS: Outpatient Medications Prior to Visit  Medication Sig Dispense Refill   Collagen-Boron-Hyaluronic Acid (MOVE FREE ULTRA JOINT HEALTH PO) Take 1 tablet by mouth.     donepezil (ARICEPT) 10 MG tablet Take 10 mg by mouth at bedtime.     Homeopathic Products (ARNICARE ARTHRITIS PO) Take by mouth.     loratadine (CLARITIN REDITABS) 10 MG dissolvable tablet Take 10 mg by mouth daily.     losartan (COZAAR) 100 MG tablet Take 100 mg by mouth daily.     melatonin 3  MG TABS tablet Take 6 mg by mouth at bedtime.     memantine (NAMENDA) 10 MG tablet Take 10 mg by mouth 2 (two) times daily.     simvastatin (ZOCOR) 20 MG tablet Take 20 mg by mouth daily.     aspirin 81 MG tablet Take 81 mg by mouth daily.     b complex vitamins tablet Take 1 tablet by mouth daily.     bacitracin ointment Apply 1 application topically 2 (two) times daily. 120 g 0   cholecalciferol (VITAMIN D) 400 UNITS TABS tablet Take 400 Units by mouth.     doxycycline (VIBRAMYCIN) 100 MG capsule Take 1 capsule (100 mg total) by mouth 2 (two) times daily.  One po bid x 7 days 14 capsule 0   Glucosamine-MSM-Hyaluronic Acd (JOINT HEALTH PO) Take by mouth.     hydrochlorothiazide (HYDRODIURIL) 25 MG tablet Take 12.5 mg by mouth. Monday, Wed, Friday     hydrOXYzine (ATARAX/VISTARIL) 25 MG tablet Take 1 tablet (25 mg total) by mouth every 6 (six) hours. 12 tablet 0   Omega-3 Fatty Acids (FISH OIL) 1000 MG CAPS Take by mouth.     risedronate (ACTONEL) 35 MG tablet Take 35 mg by mouth every 7 (seven) days. with water on empty stomach, nothing by mouth or lie down for next 30 minutes.     No facility-administered medications prior to visit.    PAST MEDICAL HISTORY: Past Medical History:  Diagnosis Date   Allergic rhinitis    Dementia ([place])    Hyperlipidemia    Hypertension    Memory loss    Osteoarthritis of both knees     PAST SURGICAL HISTORY: Past Surgical History:  Procedure Laterality Date   CHOLECYSTECTOMY     TONSILLECTOMY      FAMILY HISTORY: Family History  Problem Relation Age of Onset   Heart attack Mother    Cancer Father     SOCIAL HISTORY: Social History   Socioeconomic History   Marital status: Divorced    Spouse name: Not on file   Number of children: Not on file   Years of education: Not on file   Highest education level: Not on file  Occupational History   Not on file  Tobacco Use   Smoking status: Never   Smokeless tobacco: Never  Substance and Sexual Activity   Alcohol use: Yes    Comment: occ   Drug use: No   Sexual activity: Not on file  Other Topics Concern   Not on file  Social History Narrative   Not on file   Social Determinants of Health   Financial Resource Strain: Not on file  Food Insecurity: Not on file  Transportation Needs: Not on file  Physical Activity: Not on file  Stress: Not on file  Social Connections: Not on file  Intimate Partner Violence: Not on file    PHYSICAL EXAM  GENERAL EXAM/CONSTITUTIONAL: Vitals:  Vitals:   10/02/21 1526  BP: 125/63  Pulse: 78   Weight: 110 lb (49.9 kg)  Height: '4\' 11"'$  (1.499 m)   Body mass index is 22.22 kg/m. Wt Readings from Last 3 Encounters:  10/02/21 110 lb (49.9 kg)  10/01/17 120 lb (54.4 kg)  03/31/17 126 lb (57.2 kg)   Patient is in no distress; well developed, nourished and groomed; neck is supple   EYES: Pupils round and reactive to light, Visual fields full to confrontation, Extraocular movements intacts,   MUSCULOSKELETAL: Gait, strength, tone, movements noted in  Neurologic exam below  NEUROLOGIC: MENTAL STATUS:      No data to display            10/02/2021    3:28 PM  Montreal Cognitive Assessment   Visuospatial/ Executive (0/5) 0  Naming (0/3) 0  Attention: Read list of digits (0/2) 2  Attention: Read list of letters (0/1) 0  Attention: Serial 7 subtraction starting at 100 (0/3) 0  Language: Repeat phrase (0/2) 0  Language : Fluency (0/1) 1  Abstraction (0/2) 1  Delayed Recall (0/5) 0  Orientation (0/6) 1  Total 5    CRANIAL NERVE:  2nd, 3rd, 4th, 6th - visual fields full to confrontation, extraocular muscles intact, no nystagmus 5th - facial sensation symmetric 7th - facial strength symmetric 8th - hearing intact 11th - shoulder shrug symmetric 12th - tongue protrusion midline  MOTOR:  normal bulk and tone, at least antigravity in all 4 extremities  SENSORY:  normal and symmetric to light touch  COORDINATION:  finger-nose-finger, fine finger movements normal  GAIT/STATION:  Deferred     DIAGNOSTIC DATA (LABS, IMAGING, TESTING) - I reviewed patient records, labs, notes, testing and imaging myself where available.  No results found for: "WBC", "HGB", "HCT", "MCV", "PLT" No results found for: "NA", "K", "CL", "CO2", "GLUCOSE", "BUN", "CREATININE", "CALCIUM", "PROT", "ALBUMIN", "AST", "ALT", "ALKPHOS", "BILITOT", "GFRNONAA", "GFRAA" No results found for: "CHOL", "HDL", "LDLCALC", "LDLDIRECT", "TRIG", "CHOLHDL" No results found for: "HGBA1C" No results  found for: "VITAMINB12" No results found for: "TSH"   CT HEAD  1.  No acute intracranial abnormality identified.  2.  No calvarial fracture identified.   CT CERVICAL SPINE  1.  No acute fracture or acute traumatic malalignment identified.  2.  No paraspinal hematoma identified.    ASSESSMENT AND PLAN  88 y.o. year old female with history of dementia on Aricept and Namenda, and hypertension who is presenting with worsening dementia.  Daughter reports sundowning effect, on top of that she has some nights when she is up all night, running around the house, sometimes she is aggressive and fighting her daughter.  Her memory is also getting worse to the point that she is having trouble with eating by herself.  Informed daughter that patient dementia is getting worse, that I will prescribe her citalopram to help with her behavior.  Also informed them that management of behaviors/agitation in Dementia is very complex.  If the SSRI is not helpful then the next step would be to try a antipsychotic/or benzodiazepine.  I also advised her to start melatonin nightly to help with her sleep and to contact me if her behavior worsen.  She is comfortable with plans.  Continue to follow with your primary care doctor and return sooner if worse   1. Severe late onset Alzheimer's dementia with other behavioral disturbance [facility])      Patient Instructions  Start with Melatonin 2 hours prior to bedtime  Start with Celexa 20 mg  daily  Continue your other medications  Follow up in 1 year or sooner if worse   No orders of the defined types were placed in this encounter.   Meds ordered this encounter  Medications   citalopram (CELEXA) 20 MG tablet    Sig: Take 1 tablet (20 mg total) by mouth daily.    Dispense:  30 tablet    Refill:  11    Return in about 1 year (around 10/03/2022).  I have spent a total of 65 minutes dedicated to this  patient today, preparing to see patient, performing a medically  appropriate examination and evaluation, ordering tests and/or medications and procedures, and counseling and educating the patient/family/caregiver; independently interpreting result and communicating results to the family/patient/caregiver; and documenting clinical information in the electronic medical record.   Alric Ran, MD 10/02/2021, 4:37 PM  [facility] [address] [place], [place] [address] [phone]

## 2021-10-02 NOTE — Patient Instructions (Signed)
Start with Melatonin 2 hours prior to bedtime  Start with Celexa 20 mg  daily  Continue your other medications  Follow up in 1 year or sooner if worse

## 2021-10-24 ENCOUNTER — Other Ambulatory Visit: Payer: Self-pay | Admitting: Neurology

## 2021-10-24 NOTE — Telephone Encounter (Signed)
Rx sent for 90 day supply.

## 2022-09-30 ENCOUNTER — Other Ambulatory Visit: Payer: Self-pay

## 2022-09-30 ENCOUNTER — Telehealth: Payer: Self-pay | Admitting: Neurology

## 2022-09-30 NOTE — Telephone Encounter (Signed)
Called pt daughter to confirm appt Pt daughter states pt has discontinued  citalopram (CELEXA) 20 MG tablet. States it was "making the pt act like a demon". Canceled appt for tomorrow 10/01/2022 states pt is not in shape to be able to go out of the house. Would like to know if pt can take a xanax or half of a xanax to calm her down. Would like a call to discuss.

## 2022-09-30 NOTE — Telephone Encounter (Signed)
Please advise daughter to set up Mychart so we can do video visit tomorrow.

## 2022-09-30 NOTE — Telephone Encounter (Signed)
Call to daughter, she states that last year her mother started on celexa and was on it for 2 months and then she stopped it due to making symptoms worse. She states she has been off since for around 8 -9 months and still having symptoms. I inquired if patient has been tested for a UTI and she stated yes and was treated for it. She states that her mother is "acting like a demon, biting, hitting, and being violent, hitting with the walker". I advised it was likely not medication related as she has not taken it in a while. Daughter states she gave patient half of one her ( daughters) xanax and wanted know if that was okay and if she could be prescribed something. Advised patient that Dr. Teresa Coombs would not advise on giving medication not prescribed to patient. Daughter states she cancelled her appointment tomorrow due to not being able to get patient her due to behavior. Inquired about video visit, daughter unsure of that either. Advised would send to Dr. Teresa Coombs for advise or recommendations. Daughter Appreciative

## 2022-10-01 ENCOUNTER — Ambulatory Visit: Payer: Medicare Other | Admitting: Neurology

## 2022-10-02 ENCOUNTER — Encounter: Payer: Self-pay | Admitting: Neurology

## 2022-10-02 ENCOUNTER — Telehealth (INDEPENDENT_AMBULATORY_CARE_PROVIDER_SITE_OTHER): Payer: Medicare Other | Admitting: Neurology

## 2022-10-02 DIAGNOSIS — G301 Alzheimer's disease with late onset: Secondary | ICD-10-CM

## 2022-10-02 DIAGNOSIS — F02C11 Dementia in other diseases classified elsewhere, severe, with agitation: Secondary | ICD-10-CM

## 2022-10-02 MED ORDER — QUETIAPINE FUMARATE 25 MG PO TABS: 25.0000 mg | ORAL_TABLET | Freq: Every day | ORAL | 3 refills | Status: DC

## 2022-10-02 NOTE — Progress Notes (Signed)
[facility]  PATIENT: Candice Horne DOB: August 07, 1926  REQUESTING CLINICIAN: Merri Brunette, MD HISTORY FROM: Daughter  REASON FOR VISIT: Worsening dementia    HISTORICAL  CHIEF COMPLAINT:  Chief Complaint  Patient presents with   Dementia    Follow up.    INTERVAL HISTORY 10/02/2022 Patient was called today for video visit, history mainly obtained from daughter.  Daughter reports with her dementia nowadays there is worsening behavior, she is not sleeping at night, there are periods of agitation and irritability and this happens on a daily basis.  She has tried leaving the house twice but now daughter has cameras to watch her.  At last visit, we will tried her on antidepressant, Celexa but it was not helpful, patient was getting worse and medication discontinued.  They have not tried any other medications in terms of the behavior. Today during the video visit, patient was calm and cooperative.   HISTORY OF PRESENT ILLNESS:  This is a 89 year old woman past medical history of dementia, hypertension who is presenting with worsening dementia.  History was obtained mainly from daughter.  Per daughter, patient was never officially diagnosed but back in 2000 she was started on Aricept and Namenda for memory problem.  She did well until in the past few years when her memory got worse.  Lately she has noted that she has trouble with sleeping, some nights she is unable to sleep, she does have sundowners effect.  At night she may be up all night trying to get out of the house, tried to go "home", at time patient does not recognize her daughter and sometimes she will  try to fight her.  There is also report of hallucinations where she is seeing animals and also seeing people in her room, coming after her.   Daughter also reports that lately she has issue with knowing how to eat, she does not know how to use a fork to eat anymore and does not know how to hold a piece of pizza and eat  it.  There was 1 occasion where she tried to get out and wander outside the house.  On July 23 she had a fall and when she got home she had a seizure-like activity where her eyes rolled back her head was jerking and she did not respond lasted about 2-minute.   Patient does live with her daughter who is her caretaker.    TBI:   No past history of TBI Stroke:  no past history of stroke Seizures:   no past history of seizures Sleep:   no history of sleep apnea.  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 Cooking: Daughter Cleaning: Daughter  Shopping: Daughter Bathing: Daughter  Toileting: Daughter  Driving: No Bills: Daughter  Medications: Daughter gives her her medications  Ever left the stove on by accident?: N/A  Forget how to use items around the house?: yes Getting lost going to familiar places?: yes Forgetting loved ones names?: yes Word finding difficulty? yes Sleep: Ok    OTHER MEDICAL CONDITIONS: Dementia, Hypertension    REVIEW OF SYSTEMS: Full 14 system review of systems performed and negative with exception of: As noted in the HPI   ALLERGIES: Allergies  Allergen Reactions   Pseudoephedrine Hcl     Other reaction(s): palpitations    HOME MEDICATIONS: Outpatient Medications Prior to Visit  Medication Sig Dispense Refill   Collagen-Boron-Hyaluronic Acid (MOVE FREE ULTRA JOINT HEALTH PO) Take 1 tablet by mouth.     donepezil (ARICEPT) 10 MG tablet Take 10 mg by mouth at bedtime.     Homeopathic Products (ARNICARE ARTHRITIS PO) Take by mouth.     loratadine (CLARITIN REDITABS) 10 MG dissolvable tablet Take 10 mg by mouth daily.     losartan (COZAAR) 100 MG tablet Take 100 mg by mouth daily.     melatonin 3 MG TABS tablet Take 6 mg by mouth at bedtime.     memantine (NAMENDA) 10 MG tablet Take 10 mg by mouth 2 (two)  times daily.     simvastatin (ZOCOR) 20 MG tablet Take 20 mg by mouth daily.     No facility-administered medications prior to visit.    PAST MEDICAL HISTORY: Past Medical History:  Diagnosis Date   Allergic rhinitis    Dementia (HCC)    Hyperlipidemia    Hypertension    Memory loss    Osteoarthritis of both knees     PAST SURGICAL HISTORY: Past Surgical History:  Procedure Laterality Date   CHOLECYSTECTOMY     TONSILLECTOMY      FAMILY HISTORY: Family History  Problem Relation Age of Onset   Heart attack Mother    Cancer Father     SOCIAL HISTORY: Social History   Socioeconomic History   Marital status: Widowed    Spouse name: Not on file   Number of children: Not on file   Years of education: Not on file   Highest education level: Not on file  Occupational History   Not on file  Tobacco Use   Smoking status: Never   Smokeless tobacco: Never  Substance and Sexual Activity   Alcohol use: Yes    Comment: occ   Drug use: No   Sexual activity: Not on file  Other Topics Concern   Not on file  Social History Narrative   Not on file   Social Determinants of Health   Financial Resource Strain: Not on file  Food Insecurity: Not on file  Transportation Needs: Not on file  Physical Activity: Not on file  Stress: Not on file  Social Connections: Unknown (08/25/2021)   Received from [facility], [facility]   Social Network    Social Network: Not on file  Intimate Partner Violence: Unknown (08/25/2021)   Received from [facility] [place], [facility]   HITS    Physically Hurt: Not on file    Insult or Talk Down To: Not on file    Threaten Physical Harm: Not on file    Scream or Curse: Not on file    PHYSICAL EXAM  GENERAL EXAM/CONSTITUTIONAL: Vitals:  There were no vitals filed for this visit.  There is no height or weight on file to calculate BMI. Wt Readings from Last 3 Encounters:  10/02/21 110 lb (49.9 kg)  10/01/17 120 lb (54.4 kg)  03/31/17  126 lb (57.2 kg)   Patient is in no distress; well developed, nourished and groomed; neck is supple   NEUROLOGIC: MENTAL STATUS:      No data to display            10/02/2021    3:28 PM  Montreal Cognitive Assessment   Visuospatial/ Executive (0/5) 0  Naming (0/3) 0  Attention: Read list of digits (0/2) 2  Attention: Read list of letters (0/1) 0  Attention: Serial 7 subtraction starting at 100 (0/3) 0  Language: Repeat phrase (0/2) 0  Language : Fluency (0/1) 1  Abstraction (0/2) 1  Delayed Recall (0/5) 0  Orientation (0/6) 1  Total 5    DIAGNOSTIC DATA (LABS, IMAGING, TESTING) - I reviewed patient records, labs, notes, testing and imaging myself where available.  No results found for: "WBC", "HGB", "HCT", "MCV", "PLT" No results found for: "NA", "K", "CL", "CO2", "GLUCOSE", "BUN", "CREATININE", "CALCIUM", "PROT", "ALBUMIN", "AST", "ALT", "ALKPHOS", "BILITOT", "GFRNONAA", "GFRAA" No results found for: "CHOL", "HDL", "LDLCALC", "LDLDIRECT", "TRIG", "CHOLHDL" No results found for: "HGBA1C" No results found for: "VITAMINB12" No results found for: "TSH"   CT HEAD  1.  No acute intracranial abnormality identified.  2.  No calvarial fracture identified.   CT CERVICAL SPINE  1.  No acute fracture or acute traumatic malalignment identified.  2.  No paraspinal hematoma identified.    ASSESSMENT AND PLAN  89 y.o. year old female with history of dementia on Aricept and Namenda, and hypertension who is presenting with worsening dementia and agitation.  We tried her on antidepressant, Celexa but it was not helpful.  Plan is to try her on antipsychotic quetiapine, we will increase as needed but if medication is not helpful, we will add benzodiazepine.  This was explained to daughter she is comfortable with plan.  I will see her in 6 months for follow-up but they understand to contact me if she has worsening agitation.    1. Severe late onset Alzheimer's dementia with  agitation (HCC)     Patient Instructions  Discontinue Celexa Start Seroquel 25 mg nightly, if there is drowsiness, please decrease the dose to half a tablet. Please continue to update me on her behavior as we start Celexa.   Follow-up in 6 months or sooner if worse.  No orders of the defined types were placed in this encounter.   Meds ordered this encounter  Medications   QUEtiapine (SEROQUEL) 25 MG tablet    Sig: Take 1 tablet (25 mg total) by mouth at bedtime.    Dispense:  30 tablet    Refill:  3    Return in about 6 months (around 04/03/2023).   Virtual Visit via Video Note  I connected with  Wayne Both on 10/02/22 at 12:00 PM EDT by a video enabled telemedicine application and verified that I am speaking with the correct person using two identifiers.  Location: Patient: Home Provider: [facility] Office   I discussed the limitations of evaluation and management by telemedicine and the availability of in person appointments. The patient expressed understanding and agreed to proceed.   I discussed the assessment and treatment plan with the patient. The patient was provided an opportunity to ask questions and all were answered. The patient agreed with the plan and demonstrated an understanding of the instructions.   The patient was advised to call back or seek an in-person evaluation if the symptoms worsen or if the condition fails to improve as anticipated.  I provided 20 minutes of non-face-to-face time during this encounter.   I have spent a total of 20 minutes dedicated to this patient today, preparing to see patient, performing a medically appropriate examination and evaluation, ordering tests and/or medications and procedures, and counseling and educating the patient/family/caregiver; independently interpreting result and communicating results to the family/patient/caregiver; and documenting clinical information in the electronic medical record.   Windell Norfolk, MD  10/02/2022, 3:41 PM  [facility] [address] [place], [place] [address] [phone]

## 2022-10-02 NOTE — Patient Instructions (Addendum)
Discontinue Celexa Start Seroquel 25 mg nightly, if there is drowsiness, please decrease the dose to half a tablet. Please continue to update me on her behavior as we start Celexa.   Follow-up in 6 months or sooner if worse.

## 2022-12-03 ENCOUNTER — Other Ambulatory Visit: Payer: Self-pay | Admitting: Neurology

## 2022-12-16 ENCOUNTER — Emergency Department (HOSPITAL_BASED_OUTPATIENT_CLINIC_OR_DEPARTMENT_OTHER)
Admission: EM | Admit: 2022-12-16 | Discharge: 2022-12-16 | Disposition: A | Discharge status: Home / Self Care | Payer: Medicare Other | Attending: Emergency Medicine | Admitting: Emergency Medicine

## 2022-12-16 ENCOUNTER — Emergency Department (HOSPITAL_BASED_OUTPATIENT_CLINIC_OR_DEPARTMENT_OTHER): Payer: Medicare Other

## 2022-12-16 ENCOUNTER — Other Ambulatory Visit: Payer: Self-pay

## 2022-12-16 ENCOUNTER — Encounter (HOSPITAL_BASED_OUTPATIENT_CLINIC_OR_DEPARTMENT_OTHER): Payer: Self-pay | Admitting: Emergency Medicine

## 2022-12-16 DIAGNOSIS — S0990XA Unspecified injury of head, initial encounter: Secondary | ICD-10-CM | POA: Diagnosis not present

## 2022-12-16 DIAGNOSIS — S199XXA Unspecified injury of neck, initial encounter: Secondary | ICD-10-CM | POA: Diagnosis not present

## 2022-12-16 DIAGNOSIS — I10 Essential (primary) hypertension: Secondary | ICD-10-CM | POA: Insufficient documentation

## 2022-12-16 DIAGNOSIS — G9389 Other specified disorders of brain: Secondary | ICD-10-CM | POA: Diagnosis not present

## 2022-12-16 DIAGNOSIS — Z79899 Other long term (current) drug therapy: Secondary | ICD-10-CM | POA: Insufficient documentation

## 2022-12-16 DIAGNOSIS — W01190A Fall on same level from slipping, tripping and stumbling with subsequent striking against furniture, initial encounter: Secondary | ICD-10-CM | POA: Insufficient documentation

## 2022-12-16 DIAGNOSIS — S0101XA Laceration without foreign body of scalp, initial encounter: Secondary | ICD-10-CM | POA: Diagnosis not present

## 2022-12-16 NOTE — Discharge Instructions (Signed)
You have a posterior scalp laceration.  4 staples were placed and needs to be removed in about a week.  Take Tylenol or Motrin for pain and you may wash your hair when you get home  See your doctor for follow-up  Return to ER if she has uncontrolled bleeding or severe pain or vomiting

## 2022-12-16 NOTE — ED Notes (Signed)
 Discharge paperwork reviewed entirely with patient, including follow up care. Pain was under control. No prescriptions were called in, but all questions were addressed.  Pt verbalized understanding as well as all parties involved. No questions or concerns voiced at the time of discharge. No acute distress noted.   Pt was wheeled out to the PVA in a wheelchair without incident.  Pt advised they will notify their PCP immediately., Pt advised they will seek followup care with a specialist and followup with their PCP. , and Pt was given information to obtain and notify a PCP.

## 2022-12-16 NOTE — ED Triage Notes (Signed)
Pt fell, hitting head on solid wood dresser; no LOC, no blood thinners; lacs to posterior scalp, bleeding controlled

## 2022-12-16 NOTE — ED Notes (Signed)
Small bleeding noted to lac, pressure dressing applied

## 2022-12-16 NOTE — ED Notes (Signed)
Patient in wheelchair for now.

## 2022-12-16 NOTE — ED Provider Notes (Signed)
Candice Horne   CSN: [id number] Arrival date & time: 12/16/22  1633     History  Chief Complaint  Patient presents with   Fall   Laceration    Candice Horne is a 89 y.o. female history of hypertension here presenting with fall.  Patient had a mechanical fall and hit her head on the dresser.  Patient has a laceration.  Patient is demented and unable to give much history but daughter was there to witness it.  Patient is not on blood thinner.  Per daughter, patient does not want a tetanus shot.  The history is provided by the patient.       Home Medications Prior to Admission medications   Medication Sig Start Date End Date Taking? Authorizing Provider  Collagen-Boron-Hyaluronic Acid (MOVE FREE ULTRA JOINT HEALTH PO) Take 1 tablet by mouth.    [provider]  donepezil (ARICEPT) 10 MG tablet Take 10 mg by mouth at bedtime.    [provider]  Homeopathic Products (ARNICARE ARTHRITIS PO) Take by mouth.    [provider]  loratadine (CLARITIN REDITABS) 10 MG dissolvable tablet Take 10 mg by mouth daily.    [provider]  losartan (COZAAR) 100 MG tablet Take 100 mg by mouth daily.    [provider]  melatonin 3 MG TABS tablet Take 6 mg by mouth at bedtime.    [provider]  memantine (NAMENDA) 10 MG tablet Take 10 mg by mouth 2 (two) times daily.    [provider]  QUEtiapine (SEROQUEL) 25 MG tablet TAKE 1 TABLET BY MOUTH EVERYDAY AT BEDTIME 12/03/22   Windell Norfolk, MD  simvastatin (ZOCOR) 20 MG tablet Take 20 mg by mouth daily.    [provider]      Allergies    Pseudoephedrine hcl    Review of Systems   Review of Systems  Skin:  Positive for wound.  All other systems reviewed and are negative.   Physical Exam Updated Vital Signs BP (!) 158/77 (BP Location: Right Arm)   Pulse 78   Temp 97.7 F (36.5 C)   Resp 20   Ht 4\' 11"   (1.499 m)   Wt 69.9 kg   SpO2 98%   BMI 31.10 kg/m  Physical Exam Vitals and nursing Horne reviewed.  Constitutional:      Comments: Demented  HENT:     Head:     Comments: Patient has a 8 cm laceration in the posterior scalp area with dried blood    Mouth/Throat:     Mouth: Mucous membranes are moist.  Eyes:     Extraocular Movements: Extraocular movements intact.     Pupils: Pupils are equal, round, and reactive to light.  Cardiovascular:     Rate and Rhythm: Normal rate and regular rhythm.     Pulses: Normal pulses.     Heart sounds: Normal heart sounds.  Pulmonary:     Effort: Pulmonary effort is normal.     Breath sounds: Normal breath sounds.  Abdominal:     General: Abdomen is flat.     Palpations: Abdomen is soft.  Musculoskeletal:        General: Normal range of motion.     Cervical back: Normal range of motion and neck supple.     Comments: No obvious extremity trauma  Skin:    General: Skin is warm.     Capillary Refill: Capillary refill takes less than  2 seconds.  Neurological:     General: No focal deficit present.     Mental Status: She is alert and oriented to person, place, and time. Mental status is at baseline.  Psychiatric:        Mood and Affect: Mood normal.        Behavior: Behavior normal.     ED Results / Procedures / Treatments   Labs (all labs ordered are listed, but only abnormal results are displayed) Labs Reviewed - No data to display  EKG None  Radiology CT Head Wo Contrast  Result Date: 12/16/2022 CLINICAL DATA:  Head and neck trauma, laceration to posterior scalp, struck head on solid with dresser EXAM: CT HEAD WITHOUT CONTRAST CT CERVICAL SPINE WITHOUT CONTRAST TECHNIQUE: Multidetector CT imaging of the head and cervical spine was performed following the standard protocol without intravenous contrast. Multiplanar CT image reconstructions of the cervical spine were also generated. RADIATION DOSE REDUCTION: This exam was performed  according to the departmental dose-optimization program which includes automated exposure control, adjustment of the mA and/or kV according to patient size and/or use of iterative reconstruction technique. COMPARISON:  06/18/2013 FINDINGS: CT HEAD FINDINGS Brain: No evidence of acute infarct, hemorrhage, mass, mass effect, or midline shift. No hydrocephalus or extra-axial fluid collection. Age related cerebral atrophy. Ex vacuo dilatation of the ventricles. Vascular: No hyperdense vessel. Skull: Negative for fracture or focal lesion. Sinuses/Orbits: Mucosal thickening in the ethmoid air cells. No acute finding in the orbits. Other: The mastoid air cells are well aerated. CT CERVICAL SPINE FINDINGS Alignment: No traumatic listhesis. Skull base and vertebrae: No acute fracture or suspicious osseous lesion. Soft tissues and spinal canal: No prevertebral fluid or swelling. No visible canal hematoma. Disc levels: Degenerative changes in the cervical spine.No high-grade spinal canal stenosis. Upper chest: No focal pulmonary opacity or pleural effusion. IMPRESSION: 1. No acute intracranial process. 2. No acute fracture or traumatic listhesis in the cervical spine. Electronically Signed   By: Wiliam Ke M.D.   On: 12/16/2022 17:56   CT Cervical Spine Wo Contrast  Result Date: 12/16/2022 CLINICAL DATA:  Head and neck trauma, laceration to posterior scalp, struck head on solid with dresser EXAM: CT HEAD WITHOUT CONTRAST CT CERVICAL SPINE WITHOUT CONTRAST TECHNIQUE: Multidetector CT imaging of the head and cervical spine was performed following the standard protocol without intravenous contrast. Multiplanar CT image reconstructions of the cervical spine were also generated. RADIATION DOSE REDUCTION: This exam was performed according to the departmental dose-optimization program which includes automated exposure control, adjustment of the mA and/or kV according to patient size and/or use of iterative reconstruction  technique. COMPARISON:  06/18/2013 FINDINGS: CT HEAD FINDINGS Brain: No evidence of acute infarct, hemorrhage, mass, mass effect, or midline shift. No hydrocephalus or extra-axial fluid collection. Age related cerebral atrophy. Ex vacuo dilatation of the ventricles. Vascular: No hyperdense vessel. Skull: Negative for fracture or focal lesion. Sinuses/Orbits: Mucosal thickening in the ethmoid air cells. No acute finding in the orbits. Other: The mastoid air cells are well aerated. CT CERVICAL SPINE FINDINGS Alignment: No traumatic listhesis. Skull base and vertebrae: No acute fracture or suspicious osseous lesion. Soft tissues and spinal canal: No prevertebral fluid or swelling. No visible canal hematoma. Disc levels: Degenerative changes in the cervical spine.No high-grade spinal canal stenosis. Upper chest: No focal pulmonary opacity or pleural effusion. IMPRESSION: 1. No acute intracranial process. 2. No acute fracture or traumatic listhesis in the cervical spine. Electronically Signed   By: Elaina Pattee.D.  On: 12/16/2022 17:56    Procedures Procedures    LACERATION REPAIR Performed by: Richardean Canal Authorized by: Richardean Canal Consent: Verbal consent obtained. Risks and benefits: risks, benefits and alternatives were discussed Consent given by: patient Patient identity confirmed: provided demographic data Prepped and Draped in normal sterile fashion Wound explored  Laceration Location: posterior scalp   Laceration Length: 8 cm  No Foreign Bodies seen or palpated  Anesthesia: none  Irrigation method: syringe Amount of cleaning: standard  Skin closure: staple  Number of staples: 4  Technique:   Patient tolerance: Patient tolerated the procedure well with no immediate complications.   Medications Ordered in ED Medications - No data to display  ED Course/ Medical Decision Making/ A&P                                 Medical Decision Making Candice Horne is a 89 y.o. female  here presenting with fall.  Patient has an obvious laceration in the posterior scalp.  CT head and neck unremarkable.  4 staples were placed.  Per daughter, she does not usually like shots so does not want to update tetanus right now.  Staple removal in a week.  Patient's mental status at baseline   Problems Addressed: Scalp laceration, initial encounter: acute illness or injury  Amount and/or Complexity of Data Reviewed Radiology: ordered and independent interpretation performed. Decision-making details documented in ED Course.    Final Clinical Impression(s) / ED Diagnoses Final diagnoses:  None    Rx / DC Orders ED Discharge Orders     None         Charlynne Pander, MD 12/16/22 [phone]

## 2023-02-16 ENCOUNTER — Encounter: Payer: Self-pay | Admitting: Neurology

## 2023-02-16 ENCOUNTER — Telehealth: Payer: Self-pay

## 2023-02-16 NOTE — Telephone Encounter (Signed)
 Call to daughter, she reports she thinks her mom is dying and in her last stages. I offered sympathy and advised Dr. Gregg does not treat UTI's.She states her mom spits out  pills and refuses medication and is barely eating. Daughter does not want to take her to the ER due to cost and wait.  Encouraged her to call back to PCP and request VV or could reach out to area hospice services to see if she is appropriate for services. Several numbers provided for local hospices. Daughter appreciative and aware we don't serve as attending physicians.

## 2023-02-19 DIAGNOSIS — Z9183 Wandering in diseases classified elsewhere: Secondary | ICD-10-CM | POA: Diagnosis not present

## 2023-02-19 DIAGNOSIS — F02C11 Dementia in other diseases classified elsewhere, severe, with agitation: Secondary | ICD-10-CM | POA: Diagnosis not present

## 2023-02-19 DIAGNOSIS — Z9181 History of falling: Secondary | ICD-10-CM | POA: Diagnosis not present

## 2023-02-19 DIAGNOSIS — F02C2 Dementia in other diseases classified elsewhere, severe, with psychotic disturbance: Secondary | ICD-10-CM | POA: Diagnosis not present

## 2023-02-19 DIAGNOSIS — I10 Essential (primary) hypertension: Secondary | ICD-10-CM | POA: Diagnosis not present

## 2023-02-19 DIAGNOSIS — G301 Alzheimer's disease with late onset: Secondary | ICD-10-CM | POA: Diagnosis not present

## 2023-02-19 DIAGNOSIS — J309 Allergic rhinitis, unspecified: Secondary | ICD-10-CM | POA: Diagnosis not present

## 2023-02-20 DIAGNOSIS — I10 Essential (primary) hypertension: Secondary | ICD-10-CM | POA: Diagnosis not present

## 2023-02-20 DIAGNOSIS — F02C2 Dementia in other diseases classified elsewhere, severe, with psychotic disturbance: Secondary | ICD-10-CM | POA: Diagnosis not present

## 2023-02-20 DIAGNOSIS — Z9181 History of falling: Secondary | ICD-10-CM | POA: Diagnosis not present

## 2023-02-20 DIAGNOSIS — Z9183 Wandering in diseases classified elsewhere: Secondary | ICD-10-CM | POA: Diagnosis not present

## 2023-02-20 DIAGNOSIS — F02C11 Dementia in other diseases classified elsewhere, severe, with agitation: Secondary | ICD-10-CM | POA: Diagnosis not present

## 2023-02-20 DIAGNOSIS — G301 Alzheimer's disease with late onset: Secondary | ICD-10-CM | POA: Diagnosis not present

## 2023-02-21 DIAGNOSIS — Z9183 Wandering in diseases classified elsewhere: Secondary | ICD-10-CM | POA: Diagnosis not present

## 2023-02-21 DIAGNOSIS — G301 Alzheimer's disease with late onset: Secondary | ICD-10-CM | POA: Diagnosis not present

## 2023-02-21 DIAGNOSIS — F02C2 Dementia in other diseases classified elsewhere, severe, with psychotic disturbance: Secondary | ICD-10-CM | POA: Diagnosis not present

## 2023-02-21 DIAGNOSIS — Z9181 History of falling: Secondary | ICD-10-CM | POA: Diagnosis not present

## 2023-02-21 DIAGNOSIS — F02C11 Dementia in other diseases classified elsewhere, severe, with agitation: Secondary | ICD-10-CM | POA: Diagnosis not present

## 2023-02-21 DIAGNOSIS — I10 Essential (primary) hypertension: Secondary | ICD-10-CM | POA: Diagnosis not present

## 2023-02-22 DIAGNOSIS — I10 Essential (primary) hypertension: Secondary | ICD-10-CM | POA: Diagnosis not present

## 2023-02-22 DIAGNOSIS — Z9183 Wandering in diseases classified elsewhere: Secondary | ICD-10-CM | POA: Diagnosis not present

## 2023-02-22 DIAGNOSIS — F02C11 Dementia in other diseases classified elsewhere, severe, with agitation: Secondary | ICD-10-CM | POA: Diagnosis not present

## 2023-02-22 DIAGNOSIS — G301 Alzheimer's disease with late onset: Secondary | ICD-10-CM | POA: Diagnosis not present

## 2023-02-22 DIAGNOSIS — F02C2 Dementia in other diseases classified elsewhere, severe, with psychotic disturbance: Secondary | ICD-10-CM | POA: Diagnosis not present

## 2023-02-22 DIAGNOSIS — Z9181 History of falling: Secondary | ICD-10-CM | POA: Diagnosis not present

## 2023-03-07 DEATH — deceased
